# Patient Record
Sex: Female | Born: 1970 | Race: Black or African American | Hispanic: No | Smoking: Current every day smoker
Health system: Southern US, Community
[De-identification: ages and names within clinical notes are randomized; demographics above are authoritative.]

## PROBLEM LIST (undated history)

## (undated) DIAGNOSIS — M5430 Sciatica, unspecified side: Secondary | ICD-10-CM

## (undated) DIAGNOSIS — G8929 Other chronic pain: Secondary | ICD-10-CM

## (undated) DIAGNOSIS — F419 Anxiety disorder, unspecified: Secondary | ICD-10-CM

## (undated) DIAGNOSIS — J45909 Unspecified asthma, uncomplicated: Secondary | ICD-10-CM

## (undated) DIAGNOSIS — G43909 Migraine, unspecified, not intractable, without status migrainosus: Secondary | ICD-10-CM

## (undated) DIAGNOSIS — M797 Fibromyalgia: Secondary | ICD-10-CM

## (undated) DIAGNOSIS — M1990 Unspecified osteoarthritis, unspecified site: Secondary | ICD-10-CM

## (undated) DIAGNOSIS — G629 Polyneuropathy, unspecified: Secondary | ICD-10-CM

## (undated) DIAGNOSIS — I10 Essential (primary) hypertension: Secondary | ICD-10-CM

## (undated) DIAGNOSIS — E119 Type 2 diabetes mellitus without complications: Secondary | ICD-10-CM

## (undated) DIAGNOSIS — F329 Major depressive disorder, single episode, unspecified: Secondary | ICD-10-CM

## (undated) DIAGNOSIS — F32A Depression, unspecified: Secondary | ICD-10-CM

## (undated) HISTORY — PX: GASTRIC BYPASS: SHX52

## (undated) HISTORY — PX: OSTEOTOMY: SHX137

## (undated) HISTORY — PX: APPENDECTOMY: SHX54

---

## 2015-05-09 DIAGNOSIS — M545 Low back pain: Secondary | ICD-10-CM | POA: Diagnosis not present

## 2015-05-09 DIAGNOSIS — G894 Chronic pain syndrome: Secondary | ICD-10-CM | POA: Diagnosis not present

## 2015-05-09 DIAGNOSIS — M25512 Pain in left shoulder: Secondary | ICD-10-CM | POA: Diagnosis not present

## 2015-05-09 DIAGNOSIS — Z79891 Long term (current) use of opiate analgesic: Secondary | ICD-10-CM | POA: Diagnosis not present

## 2015-05-09 DIAGNOSIS — M542 Cervicalgia: Secondary | ICD-10-CM | POA: Diagnosis not present

## 2015-05-13 DIAGNOSIS — E559 Vitamin D deficiency, unspecified: Secondary | ICD-10-CM | POA: Diagnosis not present

## 2015-05-13 DIAGNOSIS — M797 Fibromyalgia: Secondary | ICD-10-CM | POA: Diagnosis not present

## 2015-05-13 DIAGNOSIS — M545 Low back pain: Secondary | ICD-10-CM | POA: Diagnosis not present

## 2015-05-13 DIAGNOSIS — Z885 Allergy status to narcotic agent status: Secondary | ICD-10-CM | POA: Diagnosis not present

## 2015-05-13 DIAGNOSIS — Z888 Allergy status to other drugs, medicaments and biological substances status: Secondary | ICD-10-CM | POA: Diagnosis not present

## 2015-05-13 DIAGNOSIS — I10 Essential (primary) hypertension: Secondary | ICD-10-CM | POA: Diagnosis not present

## 2015-05-13 DIAGNOSIS — G8929 Other chronic pain: Secondary | ICD-10-CM | POA: Diagnosis not present

## 2015-05-13 DIAGNOSIS — Z882 Allergy status to sulfonamides status: Secondary | ICD-10-CM | POA: Diagnosis not present

## 2015-05-13 DIAGNOSIS — Z880 Allergy status to penicillin: Secondary | ICD-10-CM | POA: Diagnosis not present

## 2015-05-13 DIAGNOSIS — R350 Frequency of micturition: Secondary | ICD-10-CM | POA: Diagnosis not present

## 2015-05-13 DIAGNOSIS — R32 Unspecified urinary incontinence: Secondary | ICD-10-CM | POA: Diagnosis not present

## 2015-05-13 DIAGNOSIS — F329 Major depressive disorder, single episode, unspecified: Secondary | ICD-10-CM | POA: Diagnosis not present

## 2015-05-13 DIAGNOSIS — Z23 Encounter for immunization: Secondary | ICD-10-CM | POA: Diagnosis not present

## 2015-05-13 DIAGNOSIS — E119 Type 2 diabetes mellitus without complications: Secondary | ICD-10-CM | POA: Diagnosis not present

## 2015-05-24 DIAGNOSIS — S39012D Strain of muscle, fascia and tendon of lower back, subsequent encounter: Secondary | ICD-10-CM | POA: Diagnosis not present

## 2015-05-24 DIAGNOSIS — R1012 Left upper quadrant pain: Secondary | ICD-10-CM | POA: Diagnosis not present

## 2015-05-24 DIAGNOSIS — M899 Disorder of bone, unspecified: Secondary | ICD-10-CM | POA: Diagnosis not present

## 2015-05-24 DIAGNOSIS — I10 Essential (primary) hypertension: Secondary | ICD-10-CM | POA: Diagnosis not present

## 2015-05-24 DIAGNOSIS — Z113 Encounter for screening for infections with a predominantly sexual mode of transmission: Secondary | ICD-10-CM | POA: Diagnosis not present

## 2015-05-24 DIAGNOSIS — G8929 Other chronic pain: Secondary | ICD-10-CM | POA: Diagnosis not present

## 2015-05-24 DIAGNOSIS — E119 Type 2 diabetes mellitus without complications: Secondary | ICD-10-CM | POA: Diagnosis not present

## 2015-05-24 DIAGNOSIS — F329 Major depressive disorder, single episode, unspecified: Secondary | ICD-10-CM | POA: Diagnosis not present

## 2015-05-24 DIAGNOSIS — Z30430 Encounter for insertion of intrauterine contraceptive device: Secondary | ICD-10-CM | POA: Diagnosis not present

## 2015-05-24 DIAGNOSIS — M549 Dorsalgia, unspecified: Secondary | ICD-10-CM | POA: Diagnosis not present

## 2015-06-06 DIAGNOSIS — M5442 Lumbago with sciatica, left side: Secondary | ICD-10-CM | POA: Diagnosis not present

## 2015-06-06 DIAGNOSIS — G8929 Other chronic pain: Secondary | ICD-10-CM | POA: Diagnosis not present

## 2015-06-06 DIAGNOSIS — M5441 Lumbago with sciatica, right side: Secondary | ICD-10-CM | POA: Diagnosis not present

## 2015-06-25 DIAGNOSIS — T8389XA Other specified complication of genitourinary prosthetic devices, implants and grafts, initial encounter: Secondary | ICD-10-CM | POA: Diagnosis not present

## 2015-06-25 DIAGNOSIS — N920 Excessive and frequent menstruation with regular cycle: Secondary | ICD-10-CM | POA: Diagnosis not present

## 2015-06-25 DIAGNOSIS — G8929 Other chronic pain: Secondary | ICD-10-CM | POA: Diagnosis not present

## 2015-07-10 DIAGNOSIS — J40 Bronchitis, not specified as acute or chronic: Secondary | ICD-10-CM | POA: Diagnosis not present

## 2015-08-01 DIAGNOSIS — M549 Dorsalgia, unspecified: Secondary | ICD-10-CM | POA: Diagnosis not present

## 2015-08-01 DIAGNOSIS — Z634 Disappearance and death of family member: Secondary | ICD-10-CM | POA: Diagnosis not present

## 2015-08-01 DIAGNOSIS — G4700 Insomnia, unspecified: Secondary | ICD-10-CM | POA: Diagnosis not present

## 2015-08-01 DIAGNOSIS — G8929 Other chronic pain: Secondary | ICD-10-CM | POA: Diagnosis not present

## 2015-08-31 DIAGNOSIS — G894 Chronic pain syndrome: Secondary | ICD-10-CM | POA: Diagnosis not present

## 2015-08-31 DIAGNOSIS — Z79891 Long term (current) use of opiate analgesic: Secondary | ICD-10-CM | POA: Diagnosis not present

## 2015-08-31 DIAGNOSIS — M542 Cervicalgia: Secondary | ICD-10-CM | POA: Diagnosis not present

## 2015-08-31 DIAGNOSIS — Z5181 Encounter for therapeutic drug level monitoring: Secondary | ICD-10-CM | POA: Diagnosis not present

## 2015-08-31 DIAGNOSIS — M25512 Pain in left shoulder: Secondary | ICD-10-CM | POA: Diagnosis not present

## 2015-08-31 DIAGNOSIS — M791 Myalgia: Secondary | ICD-10-CM | POA: Diagnosis not present

## 2015-08-31 DIAGNOSIS — M545 Low back pain: Secondary | ICD-10-CM | POA: Diagnosis not present

## 2015-09-06 DIAGNOSIS — M545 Low back pain: Secondary | ICD-10-CM | POA: Diagnosis not present

## 2015-09-06 DIAGNOSIS — T8389XA Other specified complication of genitourinary prosthetic devices, implants and grafts, initial encounter: Secondary | ICD-10-CM | POA: Diagnosis not present

## 2015-09-06 DIAGNOSIS — N921 Excessive and frequent menstruation with irregular cycle: Secondary | ICD-10-CM | POA: Diagnosis not present

## 2015-09-06 DIAGNOSIS — N99821 Postprocedural hemorrhage and hematoma of a genitourinary system organ or structure following other procedure: Secondary | ICD-10-CM | POA: Diagnosis not present

## 2015-09-06 DIAGNOSIS — Z79899 Other long term (current) drug therapy: Secondary | ICD-10-CM | POA: Diagnosis not present

## 2015-09-06 DIAGNOSIS — M174 Other bilateral secondary osteoarthritis of knee: Secondary | ICD-10-CM | POA: Diagnosis not present

## 2015-09-06 DIAGNOSIS — N920 Excessive and frequent menstruation with regular cycle: Secondary | ICD-10-CM | POA: Diagnosis not present

## 2015-09-06 DIAGNOSIS — Z1389 Encounter for screening for other disorder: Secondary | ICD-10-CM | POA: Diagnosis not present

## 2015-09-06 DIAGNOSIS — E119 Type 2 diabetes mellitus without complications: Secondary | ICD-10-CM | POA: Diagnosis not present

## 2015-09-06 DIAGNOSIS — Z87440 Personal history of urinary (tract) infections: Secondary | ICD-10-CM | POA: Diagnosis not present

## 2015-09-06 DIAGNOSIS — G8929 Other chronic pain: Secondary | ICD-10-CM | POA: Diagnosis not present

## 2015-10-30 DIAGNOSIS — M545 Low back pain: Secondary | ICD-10-CM | POA: Diagnosis not present

## 2015-10-30 DIAGNOSIS — G8929 Other chronic pain: Secondary | ICD-10-CM | POA: Diagnosis not present

## 2015-11-08 DIAGNOSIS — M791 Myalgia: Secondary | ICD-10-CM | POA: Diagnosis not present

## 2015-11-08 DIAGNOSIS — Z79891 Long term (current) use of opiate analgesic: Secondary | ICD-10-CM | POA: Diagnosis not present

## 2015-11-08 DIAGNOSIS — M545 Low back pain: Secondary | ICD-10-CM | POA: Diagnosis not present

## 2015-11-08 DIAGNOSIS — G894 Chronic pain syndrome: Secondary | ICD-10-CM | POA: Diagnosis not present

## 2015-11-08 DIAGNOSIS — M25512 Pain in left shoulder: Secondary | ICD-10-CM | POA: Diagnosis not present

## 2015-11-08 DIAGNOSIS — M542 Cervicalgia: Secondary | ICD-10-CM | POA: Diagnosis not present

## 2015-12-07 DIAGNOSIS — M174 Other bilateral secondary osteoarthritis of knee: Secondary | ICD-10-CM | POA: Diagnosis not present

## 2015-12-07 DIAGNOSIS — M5136 Other intervertebral disc degeneration, lumbar region: Secondary | ICD-10-CM | POA: Diagnosis not present

## 2015-12-07 DIAGNOSIS — M5137 Other intervertebral disc degeneration, lumbosacral region: Secondary | ICD-10-CM | POA: Diagnosis not present

## 2015-12-07 DIAGNOSIS — G8929 Other chronic pain: Secondary | ICD-10-CM | POA: Diagnosis not present

## 2015-12-07 DIAGNOSIS — M5442 Lumbago with sciatica, left side: Secondary | ICD-10-CM | POA: Diagnosis not present

## 2015-12-07 DIAGNOSIS — M5441 Lumbago with sciatica, right side: Secondary | ICD-10-CM | POA: Diagnosis not present

## 2016-01-10 DIAGNOSIS — Z79891 Long term (current) use of opiate analgesic: Secondary | ICD-10-CM | POA: Diagnosis not present

## 2016-01-10 DIAGNOSIS — M791 Myalgia: Secondary | ICD-10-CM | POA: Diagnosis not present

## 2016-01-10 DIAGNOSIS — G894 Chronic pain syndrome: Secondary | ICD-10-CM | POA: Diagnosis not present

## 2016-01-10 DIAGNOSIS — M545 Low back pain: Secondary | ICD-10-CM | POA: Diagnosis not present

## 2016-01-10 DIAGNOSIS — M542 Cervicalgia: Secondary | ICD-10-CM | POA: Diagnosis not present

## 2016-01-10 DIAGNOSIS — M25512 Pain in left shoulder: Secondary | ICD-10-CM | POA: Diagnosis not present

## 2016-01-28 DIAGNOSIS — J029 Acute pharyngitis, unspecified: Secondary | ICD-10-CM | POA: Diagnosis not present

## 2016-01-28 DIAGNOSIS — R509 Fever, unspecified: Secondary | ICD-10-CM | POA: Diagnosis not present

## 2016-01-28 DIAGNOSIS — I10 Essential (primary) hypertension: Secondary | ICD-10-CM | POA: Diagnosis not present

## 2016-01-28 DIAGNOSIS — F17210 Nicotine dependence, cigarettes, uncomplicated: Secondary | ICD-10-CM | POA: Diagnosis not present

## 2016-01-28 DIAGNOSIS — E119 Type 2 diabetes mellitus without complications: Secondary | ICD-10-CM | POA: Diagnosis not present

## 2016-01-28 DIAGNOSIS — R062 Wheezing: Secondary | ICD-10-CM | POA: Diagnosis not present

## 2016-01-28 DIAGNOSIS — R0602 Shortness of breath: Secondary | ICD-10-CM | POA: Diagnosis not present

## 2016-01-28 DIAGNOSIS — M797 Fibromyalgia: Secondary | ICD-10-CM | POA: Diagnosis not present

## 2016-01-28 DIAGNOSIS — Z8249 Family history of ischemic heart disease and other diseases of the circulatory system: Secondary | ICD-10-CM | POA: Diagnosis not present

## 2016-01-28 DIAGNOSIS — J45909 Unspecified asthma, uncomplicated: Secondary | ICD-10-CM | POA: Diagnosis not present

## 2016-01-28 DIAGNOSIS — F329 Major depressive disorder, single episode, unspecified: Secondary | ICD-10-CM | POA: Diagnosis not present

## 2016-01-28 DIAGNOSIS — J069 Acute upper respiratory infection, unspecified: Secondary | ICD-10-CM | POA: Diagnosis not present

## 2016-01-28 DIAGNOSIS — Z9884 Bariatric surgery status: Secondary | ICD-10-CM | POA: Diagnosis not present

## 2016-02-14 DIAGNOSIS — Z23 Encounter for immunization: Secondary | ICD-10-CM | POA: Diagnosis not present

## 2016-03-14 DIAGNOSIS — M542 Cervicalgia: Secondary | ICD-10-CM | POA: Diagnosis not present

## 2016-03-14 DIAGNOSIS — M545 Low back pain: Secondary | ICD-10-CM | POA: Diagnosis not present

## 2016-03-14 DIAGNOSIS — M25512 Pain in left shoulder: Secondary | ICD-10-CM | POA: Diagnosis not present

## 2016-03-14 DIAGNOSIS — M791 Myalgia: Secondary | ICD-10-CM | POA: Diagnosis not present

## 2016-03-14 DIAGNOSIS — G894 Chronic pain syndrome: Secondary | ICD-10-CM | POA: Diagnosis not present

## 2016-03-14 DIAGNOSIS — Z79891 Long term (current) use of opiate analgesic: Secondary | ICD-10-CM | POA: Diagnosis not present

## 2016-03-25 DIAGNOSIS — Z8249 Family history of ischemic heart disease and other diseases of the circulatory system: Secondary | ICD-10-CM | POA: Diagnosis not present

## 2016-03-25 DIAGNOSIS — J45909 Unspecified asthma, uncomplicated: Secondary | ICD-10-CM | POA: Diagnosis not present

## 2016-03-25 DIAGNOSIS — M797 Fibromyalgia: Secondary | ICD-10-CM | POA: Diagnosis not present

## 2016-03-25 DIAGNOSIS — J029 Acute pharyngitis, unspecified: Secondary | ICD-10-CM | POA: Diagnosis not present

## 2016-03-25 DIAGNOSIS — E119 Type 2 diabetes mellitus without complications: Secondary | ICD-10-CM | POA: Diagnosis not present

## 2016-03-25 DIAGNOSIS — F17210 Nicotine dependence, cigarettes, uncomplicated: Secondary | ICD-10-CM | POA: Diagnosis not present

## 2016-03-25 DIAGNOSIS — R062 Wheezing: Secondary | ICD-10-CM | POA: Diagnosis not present

## 2016-03-25 DIAGNOSIS — R509 Fever, unspecified: Secondary | ICD-10-CM | POA: Diagnosis not present

## 2016-03-25 DIAGNOSIS — I10 Essential (primary) hypertension: Secondary | ICD-10-CM | POA: Diagnosis not present

## 2016-03-25 DIAGNOSIS — Z9884 Bariatric surgery status: Secondary | ICD-10-CM | POA: Diagnosis not present

## 2016-03-25 DIAGNOSIS — R0981 Nasal congestion: Secondary | ICD-10-CM | POA: Diagnosis not present

## 2016-04-01 ENCOUNTER — Ambulatory Visit
Admission: EM | Admit: 2016-04-01 | Discharge: 2016-04-01 | Disposition: A | Discharge status: Home / Self Care | Payer: Medicare Other | Attending: Family Medicine | Admitting: Family Medicine

## 2016-04-01 DIAGNOSIS — J069 Acute upper respiratory infection, unspecified: Secondary | ICD-10-CM

## 2016-04-01 HISTORY — DX: Unspecified osteoarthritis, unspecified site: M19.90

## 2016-04-01 HISTORY — DX: Other chronic pain: G89.29

## 2016-04-01 HISTORY — DX: Anxiety disorder, unspecified: F41.9

## 2016-04-01 HISTORY — DX: Unspecified asthma, uncomplicated: J45.909

## 2016-04-01 HISTORY — DX: Sciatica, unspecified side: M54.30

## 2016-04-01 HISTORY — DX: Essential (primary) hypertension: I10

## 2016-04-01 HISTORY — DX: Type 2 diabetes mellitus without complications: E11.9

## 2016-04-01 HISTORY — DX: Migraine, unspecified, not intractable, without status migrainosus: G43.909

## 2016-04-01 HISTORY — DX: Fibromyalgia: M79.7

## 2016-04-01 HISTORY — DX: Major depressive disorder, single episode, unspecified: F32.9

## 2016-04-01 HISTORY — DX: Polyneuropathy, unspecified: G62.9

## 2016-04-01 HISTORY — DX: Depression, unspecified: F32.A

## 2016-04-01 MED ORDER — PREDNISONE 10 MG (21) PO TBPK: 10.0000 mg | ORAL_TABLET | Freq: Every day | ORAL | 0 refills | Status: DC

## 2016-04-01 MED ORDER — AZITHROMYCIN 250 MG PO TABS: 250.0000 mg | ORAL_TABLET | Freq: Every day | ORAL | 0 refills | Status: DC

## 2016-04-01 NOTE — Discharge Instructions (Signed)
You will need to decrease or quit smoking this can make symptoms worse Use your albuterol inh as needed Take full dose of abx You will need to follow up with PCP once complete abx

## 2016-04-01 NOTE — ED Triage Notes (Signed)
Pt with congestion, cough, bloody and green nasal drainage, headaches, sore throat and low grade fever. Pt seen in [place] for same but was told it was viral. Had negative flu and negative strep test. Pain 7/10

## 2016-04-01 NOTE — ED Provider Notes (Signed)
CSN: [id number]     Arrival date & time 04/01/16  0930 History   First MD Initiated Contact with Patient 04/01/16 1013     Chief Complaint  Patient presents with  . Cough   (Consider location/radiation/quality/duration/timing/severity/associated sxs/prior Treatment) Pt has had a productive cough for over 1 week. Was seen and told she had a viral infection, but has had increase phlem and generalized not feeling well since. Taking some OTC decongestants with no relief. Using an inhaler with no relief. HX of heavy smoking, hx COPD.       Past Medical History:  Diagnosis Date  . Anxiety   . Asthma   . Chronic pain   . Depression   . Diabetes (HCC)   . Fibromyalgia   . HTN (hypertension)   . Migraines   . Neuropathy (HCC)   . Osteoarthritis   . Sciatica    Past Surgical History:  Procedure Laterality Date  . APPENDECTOMY    . GASTRIC BYPASS    . OSTEOTOMY     History reviewed. No pertinent family history. Social History  Substance Use Topics  . Smoking status: Current Every Day Smoker    Packs/day: 1.00    Types: Cigarettes  . Smokeless tobacco: Never Used  . Alcohol use Yes     Comment: social   OB History    No data available     Review of Systems  Constitutional: Negative.   HENT: Negative.   Respiratory: Positive for cough and wheezing.        Yellow/green phlem denies any chest pain   Cardiovascular: Negative.   Gastrointestinal: Negative.   Skin: Negative.   Neurological: Negative.     Allergies  Phenobarbital; Sulfa antibiotics; Tylenol with codeine #3 [acetaminophen-codeine]; and Naprosyn [naproxen]  Home Medications   Prior to Admission medications   Medication Sig Start Date End Date Taking? Authorizing Provider  albuterol (PROVENTIL HFA;VENTOLIN HFA) 108 (90 Base) MCG/ACT inhaler Inhale into the lungs every 6 (six) hours as needed for wheezing or shortness of breath.   Yes Historical Provider, MD  amLODipine (NORVASC) 10 MG tablet Take 10 mg  by mouth daily.   Yes Historical Provider, MD  baclofen (LIORESAL) 10 MG tablet Take 10 mg by mouth 3 (three) times daily.   Yes Historical Provider, MD  DULoxetine (CYMBALTA) 30 MG capsule Take 30 mg by mouth daily.   Yes Historical Provider, MD  DULoxetine (CYMBALTA) 60 MG capsule Take 60 mg by mouth daily.   Yes Historical Provider, MD  lisinopril (PRINIVIL,ZESTRIL) 2.5 MG tablet Take 2.5 mg by mouth daily.   Yes Historical Provider, MD  metFORMIN (GLUCOPHAGE) 500 MG tablet Take by mouth 2 (two) times daily with a meal.   Yes Historical Provider, MD  mometasone (NASONEX) 50 MCG/ACT nasal spray Place 2 sprays into the nose daily.   Yes Historical Provider, MD  pregabalin (LYRICA) 200 MG capsule Take 200 mg by mouth 3 (three) times daily.   Yes Historical Provider, MD  Tapentadol HCl (NUCYNTA) 100 MG TABS Take 100 mg by mouth 2 (two) times daily.   Yes Historical Provider, MD  tiZANidine (ZANAFLEX) 4 MG capsule Take 4 mg by mouth 3 (three) times daily.   Yes Historical Provider, MD  azithromycin (ZITHROMAX) 250 MG tablet Take 1 tablet (250 mg total) by mouth daily. Take first 2 tablets together, then 1 every day until finished. 04/01/16   Tobi BastosMelanie A Carmine Youngberg, NP  predniSONE (STERAPRED UNI-PAK 21 TAB) 10 MG (21) TBPK tablet  Take 1 tablet (10 mg total) by mouth daily. Take 6 tabs by mouth daily  for 2 days, then 5 tabs for 2 days, then 4 tabs for 2 days, then 3 tabs for 2 days, 2 tabs for 2 days, then 1 tab by mouth daily for 2 days 04/01/16   Tobi BastosMelanie A Jozelynn Danielson, NP   Meds Ordered and Administered this Visit  Medications - No data to display  BP 126/86 (BP Location: Left Arm)   Pulse 100   Temp 98.6 F (37 C) (Oral)   Resp 20   Ht 5\' 7"  (1.702 m)   Wt 216 lb (98 kg)   LMP 03/07/2016   SpO2 99%   BMI 33.83 kg/m  No data found.   Physical Exam  Constitutional: She appears well-developed.  HENT:  Head: Normocephalic.  Eyes: Pupils are equal, round, and reactive to light.  Neck: Normal  range of motion.  Cardiovascular: Normal rate and regular rhythm.   Pulmonary/Chest: She has wheezes.  Productive green phlem cough, wheezing Bil lower base.   Abdominal: Soft. Bowel sounds are normal.  Musculoskeletal: Normal range of motion.  Neurological: She is alert.  Skin: Skin is warm. Capillary refill takes less than 2 seconds.    Urgent Care Course   Clinical Course     Procedures (including critical care time)  Labs Review Labs Reviewed - No data to display  I        MDM   1. Upper respiratory tract infection, unspecified type    You will need to decrease or quit smoking this can make symptoms worse  Discussed the risk with COPD and smoking  Discussed that having diabetes monitor sugar levels while taking prednisone may cause and elevation.  Use your albuterol inh as needed Take full dose of abx You will need to follow up with PCP once complete abx   Tobi BastosMelanie A Paizley Ramella, NP 04/01/16 1023

## 2016-04-04 ENCOUNTER — Telehealth: Payer: Self-pay

## 2016-04-04 NOTE — Telephone Encounter (Signed)
No number listed in chart to make a courtesy call back. [facility]

## 2016-05-24 ENCOUNTER — Encounter: Payer: Self-pay | Admitting: *Deleted

## 2016-05-24 ENCOUNTER — Ambulatory Visit
Admission: EM | Admit: 2016-05-24 | Discharge: 2016-05-24 | Disposition: A | Discharge status: Home / Self Care | Payer: Medicare Other | Attending: Family Medicine | Admitting: Family Medicine

## 2016-05-24 DIAGNOSIS — J0110 Acute frontal sinusitis, unspecified: Secondary | ICD-10-CM

## 2016-05-24 DIAGNOSIS — J209 Acute bronchitis, unspecified: Secondary | ICD-10-CM | POA: Diagnosis not present

## 2016-05-24 MED ORDER — PREDNISONE 20 MG PO TABS: 40.0000 mg | ORAL_TABLET | Freq: Every day | ORAL | 0 refills | Status: AC

## 2016-05-24 MED ORDER — FLUCONAZOLE 150 MG PO TABS: 150.0000 mg | ORAL_TABLET | Freq: Once | ORAL | 0 refills | Status: AC

## 2016-05-24 MED ORDER — AMOXICILLIN-POT CLAVULANATE 875-125 MG PO TABS: 1.0000 | ORAL_TABLET | Freq: Two times a day (BID) | ORAL | 0 refills | Status: AC

## 2016-05-24 NOTE — ED Triage Notes (Signed)
Productive cough- yellow, fever, runny nose and post nasal drainage since Friday.

## 2016-05-24 NOTE — Discharge Instructions (Signed)
Start Augmentin 875mg  twice a day as directed. Use Prednisone 40mg  daily for 5 days. Increase fluid intake to help loosen up mucus. Follow-up with your primary care provider in 4 to 5 days if not improving.

## 2016-05-24 NOTE — ED Provider Notes (Signed)
CSN: [id number]     Arrival date & time 05/24/16  1158 History   First MD Initiated Contact with Patient 05/24/16 1317     Chief Complaint  Patient presents with  . Cough  . Fever  . Nasal Congestion  . Nausea   (Consider location/radiation/quality/duration/timing/severity/associated sxs/prior Treatment) 46 year old female presents with nasal congestion, coughing, wheezing and slight nausea for about 1 week. Denies any fever, sore throat or vomiting. Coughing up thick yellow mucus and experiencing sinus pressure. Has taken Tylenol and other OTC cold/cough medications with minimal relief. Has history of multiple chronic health issues and on many medications.  Was sick with URI/Bronchitis in December 2017 and seen here. Placed on Zithromax and Prednisone with good success.    The history is provided by the patient.    Past Medical History:  Diagnosis Date  . Anxiety   . Asthma   . Chronic pain   . Depression   . Diabetes (HCC)   . Fibromyalgia   . HTN (hypertension)   . Migraines   . Neuropathy (HCC)   . Osteoarthritis   . Sciatica    Past Surgical History:  Procedure Laterality Date  . APPENDECTOMY    . GASTRIC BYPASS    . OSTEOTOMY     History reviewed. No pertinent family history. Social History  Substance Use Topics  . Smoking status: Current Every Day Smoker    Packs/day: 1.00    Types: Cigarettes  . Smokeless tobacco: Never Used  . Alcohol use Yes     Comment: social   OB History    No data available     Review of Systems  Constitutional: Positive for fatigue. Negative for chills and fever.  HENT: Positive for congestion, postnasal drip and sinus pressure. Negative for ear pain, sinus pain and sore throat.   Eyes: Negative for discharge.  Respiratory: Positive for cough and wheezing. Negative for chest tightness and shortness of breath.   Cardiovascular: Negative for chest pain.  Gastrointestinal: Positive for nausea. Negative for abdominal pain, diarrhea  and vomiting.  Musculoskeletal: Negative for neck pain and neck stiffness.  Skin: Negative for rash.  Allergic/Immunologic: Positive for immunocompromised state.  Neurological: Positive for headaches. Negative for dizziness, syncope, weakness and light-headedness.  Hematological: Negative for adenopathy.    Allergies  Phenobarbital; Sulfa antibiotics; Tylenol with codeine #3 [acetaminophen-codeine]; and Naprosyn [naproxen]  Home Medications   Prior to Admission medications   Medication Sig Start Date End Date Taking? Authorizing Provider  albuterol (PROVENTIL HFA;VENTOLIN HFA) 108 (90 Base) MCG/ACT inhaler Inhale into the lungs every 6 (six) hours as needed for wheezing or shortness of breath.   Yes Historical Provider, MD  amitriptyline (ELAVIL) 10 MG tablet Take 10 mg by mouth at bedtime.   Yes Historical Provider, MD  amLODipine (NORVASC) 10 MG tablet Take 10 mg by mouth daily.   Yes Historical Provider, MD  baclofen (LIORESAL) 10 MG tablet Take 10 mg by mouth 3 (three) times daily.   Yes Historical Provider, MD  DULoxetine (CYMBALTA) 30 MG capsule Take 30 mg by mouth daily.   Yes Historical Provider, MD  DULoxetine (CYMBALTA) 60 MG capsule Take 60 mg by mouth daily.   Yes Historical Provider, MD  lisinopril (PRINIVIL,ZESTRIL) 2.5 MG tablet Take 2.5 mg by mouth daily.   Yes Historical Provider, MD  metFORMIN (GLUCOPHAGE) 500 MG tablet Take by mouth 2 (two) times daily with a meal.   Yes Historical Provider, MD  mometasone (NASONEX) 50 MCG/ACT nasal  spray Place 2 sprays into the nose daily.   Yes Historical Provider, MD  pregabalin (LYRICA) 200 MG capsule Take 200 mg by mouth 3 (three) times daily.   Yes Historical Provider, MD  Tapentadol HCl (NUCYNTA) 100 MG TABS Take 100 mg by mouth 2 (two) times daily.   Yes Historical Provider, MD  tiZANidine (ZANAFLEX) 4 MG capsule Take 4 mg by mouth 3 (three) times daily.   Yes Historical Provider, MD  amoxicillin-clavulanate (AUGMENTIN) 875-125  MG tablet Take 1 tablet by mouth every 12 (twelve) hours. 05/24/16 05/31/16  Sudie Grumbling, NP  fluconazole (DIFLUCAN) 150 MG tablet Take 1 tablet (150 mg total) by mouth once. May repeat at end of antibiotic use if needed 05/24/16 05/24/16  Sudie Grumbling, NP  predniSONE (DELTASONE) 20 MG tablet Take 2 tablets (40 mg total) by mouth daily. 05/24/16 05/29/16  Sudie Grumbling, NP   Meds Ordered and Administered this Visit  Medications - No data to display  BP 117/87 (BP Location: Left Arm)   Pulse 93   Temp 98.3 F (36.8 C) (Oral)   Resp 16   Ht 5\' 7"  (1.702 m)   Wt 215 lb (97.5 kg)   LMP 05/21/2016 (Exact Date)   SpO2 100%   BMI 33.67 kg/m  No data found.   Physical Exam  Constitutional: She is oriented to person, place, and time. She appears well-developed and well-nourished. She appears ill. No distress.  HENT:  Head: Normocephalic and atraumatic.  Right Ear: Hearing, external ear and ear canal normal. Tympanic membrane is injected and bulging. A middle ear effusion is present.  Left Ear: Hearing, tympanic membrane, external ear and ear canal normal.  Nose: Mucosal edema and rhinorrhea present. Right sinus exhibits frontal sinus tenderness. Right sinus exhibits no maxillary sinus tenderness. Left sinus exhibits frontal sinus tenderness. Left sinus exhibits no maxillary sinus tenderness.  Mouth/Throat: Uvula is midline, oropharynx is clear and moist and mucous membranes are normal.  Neck: Normal range of motion. Neck supple.  Cardiovascular: Normal rate, regular rhythm and normal heart sounds.   Pulmonary/Chest: Effort normal. She has decreased breath sounds in the right upper field, the right lower field, the left upper field and the left lower field. She has wheezes in the right upper field, the right lower field, the left upper field and the left lower field. She has rhonchi in the right upper field, the right lower field, the left upper field and the left lower field. She has no rales.   Lymphadenopathy:    She has no cervical adenopathy.  Neurological: She is alert and oriented to person, place, and time.  Skin: Skin is warm and dry. Capillary refill takes less than 2 seconds.  Psychiatric: She has a normal mood and affect. Her behavior is normal. Judgment and thought content normal.    Urgent Care Course     Procedures (including critical care time)  Labs Review Labs Reviewed - No data to display  Imaging Review No results found.   Visual Acuity Review  Right Eye Distance:   Left Eye Distance:   Bilateral Distance:    Right Eye Near:   Left Eye Near:    Bilateral Near:         MDM   1. Acute bronchitis, unspecified organism   2. Acute non-recurrent frontal sinusitis    Recommend start Augmentin 875mg  twice a day as directed. May use Prednisone 40mg  daily for 5 days. May take Diflucan 150mg  one time at  beginning of antibiotic use and may repeat 1 tablet again at end of antibiotic if needed to prevent antibiotic-induced yeast infection. Encouraged to increase fluid intake to help loosen up mucus. Continue Nasonex and Albuterol inhaler as directed. Strongly encouraged to stop smoking. Follow-up with her primary care provider in 4 to 5 days if not improving.      Sudie GrumblingAnn Berry Hermione Havlicek, NP 05/24/16 2150

## 2016-06-04 ENCOUNTER — Encounter: Payer: Self-pay | Admitting: Emergency Medicine

## 2016-06-04 ENCOUNTER — Ambulatory Visit
Admission: EM | Admit: 2016-06-04 | Discharge: 2016-06-04 | Disposition: A | Discharge status: Home / Self Care | Payer: Medicare Other | Attending: Internal Medicine | Admitting: Internal Medicine

## 2016-06-04 DIAGNOSIS — R0981 Nasal congestion: Secondary | ICD-10-CM | POA: Diagnosis not present

## 2016-06-04 DIAGNOSIS — R059 Cough, unspecified: Secondary | ICD-10-CM

## 2016-06-04 DIAGNOSIS — R05 Cough: Secondary | ICD-10-CM

## 2016-06-04 MED ORDER — FLUTICASONE PROPIONATE 50 MCG/ACT NA SUSP: 1.0000 | Freq: Two times a day (BID) | NASAL | 2 refills | Status: DC

## 2016-06-04 MED ORDER — GUAIFENESIN ER 600 MG PO TB12: 1200.0000 mg | ORAL_TABLET | Freq: Two times a day (BID) | ORAL | 0 refills | Status: AC | PRN

## 2016-06-04 MED ORDER — BENZONATATE 100 MG PO CAPS: 200.0000 mg | ORAL_CAPSULE | Freq: Three times a day (TID) | ORAL | 0 refills | Status: DC

## 2016-06-04 NOTE — ED Triage Notes (Signed)
Patient c/o nasal congestion and dizziness that started on Saturday.  Patient finish prednisone on Friday and finished Augmentin on Friday.  Patient denies fevers. Patient reports ongoing cough and chest congestion.

## 2016-06-04 NOTE — ED Provider Notes (Signed)
[facility]    CSN: [id number] Arrival date & time: 06/04/16  1612     History   Chief Complaint Chief Complaint  Patient presents with  . Nasal Congestion  . Dizziness    HPI Sally Barnes is a 46 y.o. female.   C/o nasal congestion and positional dizziness. Treated for bronchitis last week (abx + steroids).  Still has cough.  Using albuterol as needed.  Denies fevers.  PMH sig for asthma      Past Medical History:  Diagnosis Date  . Anxiety   . Asthma   . Chronic pain   . Depression   . Diabetes (HCC)   . Fibromyalgia   . HTN (hypertension)   . Migraines   . Neuropathy (HCC)   . Osteoarthritis   . Sciatica     There are no active problems to display for this patient.   Past Surgical History:  Procedure Laterality Date  . APPENDECTOMY    . GASTRIC BYPASS    . OSTEOTOMY      OB History    No data available       Home Medications    Prior to Admission medications   Medication Sig Start Date End Date Taking? Authorizing Provider  albuterol (PROVENTIL HFA;VENTOLIN HFA) 108 (90 Base) MCG/ACT inhaler Inhale into the lungs every 6 (six) hours as needed for wheezing or shortness of breath.    Historical Provider, MD  amitriptyline (ELAVIL) 10 MG tablet Take 10 mg by mouth at bedtime.    Historical Provider, MD  amLODipine (NORVASC) 10 MG tablet Take 10 mg by mouth daily.    Historical Provider, MD  baclofen (LIORESAL) 10 MG tablet Take 10 mg by mouth 3 (three) times daily.    Historical Provider, MD  benzonatate (TESSALON) 100 MG capsule Take 2 capsules (200 mg total) by mouth every 8 (eight) hours. 06/04/16   Arnaldo NatalMichael S Constance Whittle, MD  DULoxetine (CYMBALTA) 30 MG capsule Take 30 mg by mouth daily.    Historical Provider, MD  DULoxetine (CYMBALTA) 60 MG capsule Take 60 mg by mouth daily.    Historical Provider, MD  fluticasone (FLONASE) 50 MCG/ACT nasal spray Place 1 spray into both nostrils 2 (two) times daily. 06/04/16   Arnaldo NatalMichael S Pearl Bents, MD    guaiFENesin (MUCINEX) 600 MG 12 hr tablet Take 2 tablets (1,200 mg total) by mouth 2 (two) times daily as needed for cough or to loosen phlegm. 06/04/16 06/09/16  Arnaldo NatalMichael S Bridget Jolly, MD  lisinopril (PRINIVIL,ZESTRIL) 2.5 MG tablet Take 2.5 mg by mouth daily.    Historical Provider, MD  metFORMIN (GLUCOPHAGE) 500 MG tablet Take by mouth 2 (two) times daily with a meal.    Historical Provider, MD  mometasone (NASONEX) 50 MCG/ACT nasal spray Place 2 sprays into the nose daily.    Historical Provider, MD  pregabalin (LYRICA) 200 MG capsule Take 200 mg by mouth 3 (three) times daily.    Historical Provider, MD  Tapentadol HCl (NUCYNTA) 100 MG TABS Take 100 mg by mouth 2 (two) times daily.    Historical Provider, MD  tiZANidine (ZANAFLEX) 4 MG capsule Take 4 mg by mouth 3 (three) times daily.    Historical Provider, MD    Family History History reviewed. No pertinent family history.  Social History Social History  Substance Use Topics  . Smoking status: Current Every Day Smoker    Packs/day: 1.00    Types: Cigarettes  . Smokeless tobacco: Never Used  . Alcohol use  Yes     Comment: social     Allergies   Phenobarbital; Sulfa antibiotics; Tylenol with codeine #3 [acetaminophen-codeine]; and Naprosyn [naproxen]   Review of Systems Review of Systems  Constitutional: Negative for chills and fever.  HENT: Negative for sore throat and tinnitus.   Eyes: Negative for redness.  Respiratory: Positive for cough and wheezing (intermittent). Negative for shortness of breath.   Cardiovascular: Negative for chest pain and palpitations.  Gastrointestinal: Negative for abdominal pain, diarrhea, nausea and vomiting.  Genitourinary: Negative for dysuria, frequency and urgency.  Musculoskeletal: Negative for myalgias.  Skin: Negative for rash.       No lesions  Neurological: Positive for dizziness. Negative for weakness.  Hematological: Does not bruise/bleed easily.  Psychiatric/Behavioral: Negative  for suicidal ideas.     Physical Exam Triage Vital Signs ED Triage Vitals  Enc Vitals Group     BP 06/04/16 1726 138/73     Pulse Rate 06/04/16 1726 92     Resp 06/04/16 1726 16     Temp 06/04/16 1726 98.3 F (36.8 C)     Temp Source 06/04/16 1726 Oral     SpO2 06/04/16 1726 100 %     Weight 06/04/16 1724 225 lb (102.1 kg)     Height 06/04/16 1724 5\' 7"  (1.702 m)     Head Circumference --      Peak Flow --      Pain Score 06/04/16 1725 0     Pain Loc --      Pain Edu? --      Excl. in GC? --    No data found.   Updated Vital Signs BP 138/73 (BP Location: Left Arm)   Pulse 92   Temp 98.3 F (36.8 C) (Oral)   Resp 16   Ht 5\' 7"  (1.702 m)   Wt 225 lb (102.1 kg)   LMP 05/21/2016 (Exact Date)   SpO2 100%   BMI 35.24 kg/m   Visual Acuity Right Eye Distance:   Left Eye Distance:   Bilateral Distance:    Right Eye Near:   Left Eye Near:    Bilateral Near:     Physical Exam  Constitutional: She is oriented to person, place, and time. She appears well-developed and well-nourished. No distress.  HENT:  Head: Normocephalic and atraumatic.  Mouth/Throat: Oropharynx is clear and moist.  Eyes: Conjunctivae and EOM are normal. Pupils are equal, round, and reactive to light. No scleral icterus.  Neck: Normal range of motion. Neck supple. No JVD present. No tracheal deviation present. No thyromegaly present.  Cardiovascular: Normal rate, regular rhythm and normal heart sounds.  Exam reveals no gallop and no friction rub.   No murmur heard. Pulmonary/Chest: Effort normal and breath sounds normal.  Abdominal: Soft. Bowel sounds are normal. She exhibits no distension. There is no tenderness.  Musculoskeletal: Normal range of motion. She exhibits no edema.  Lymphadenopathy:    She has no cervical adenopathy.  Neurological: She is alert and oriented to person, place, and time. No cranial nerve deficit.  Skin: Skin is warm and dry.  Psychiatric: She has a normal mood and  affect. Her behavior is normal. Judgment and thought content normal.  Nursing note and vitals reviewed.    UC Treatments / Results  Labs (all labs ordered are listed, but only abnormal results are displayed) Labs Reviewed - No data to display  EKG  EKG Interpretation None       Radiology No results found.  Procedures  Procedures (including critical care time)  Medications Ordered in UC Medications - No data to display   Initial Impression / Assessment and Plan / UC Course  I have reviewed the triage vital signs and the nursing notes.  Pertinent labs & imaging results that were available during my care of the patient were reviewed by me and considered in my medical decision making (see chart for details).     No sx of sinusitis.  Recent abx would cover nasopharyngeal flora anyway.  Likely mucus and air/fluid levels in sinuses causing dizziness.   Symptomatic treatment  Final Clinical Impressions(s) / UC Diagnoses   Final diagnoses:  Sinus congestion  Cough    New Prescriptions Discharge Medication List as of 06/04/2016  7:07 PM    START taking these medications   Details  benzonatate (TESSALON) 100 MG capsule Take 2 capsules (200 mg total) by mouth every 8 (eight) hours., Starting Mon 06/04/2016, Normal    fluticasone (FLONASE) 50 MCG/ACT nasal spray Place 1 spray into both nostrils 2 (two) times daily., Starting Mon 06/04/2016, Normal    guaiFENesin (MUCINEX) 600 MG 12 hr tablet Take 2 tablets (1,200 mg total) by mouth 2 (two) times daily as needed for cough or to loosen phlegm., Starting Mon 06/04/2016, Until Sat 06/09/2016, Normal         Arnaldo Natal, MD 06/04/16 [phone]

## 2016-06-16 ENCOUNTER — Ambulatory Visit
Admission: EM | Admit: 2016-06-16 | Discharge: 2016-06-16 | Disposition: A | Discharge status: Home / Self Care | Payer: Medicare Other | Attending: Emergency Medicine | Admitting: Emergency Medicine

## 2016-06-16 ENCOUNTER — Encounter: Payer: Self-pay | Admitting: Emergency Medicine

## 2016-06-16 DIAGNOSIS — F17210 Nicotine dependence, cigarettes, uncomplicated: Secondary | ICD-10-CM | POA: Insufficient documentation

## 2016-06-16 DIAGNOSIS — J029 Acute pharyngitis, unspecified: Secondary | ICD-10-CM | POA: Insufficient documentation

## 2016-06-16 DIAGNOSIS — Z79899 Other long term (current) drug therapy: Secondary | ICD-10-CM | POA: Insufficient documentation

## 2016-06-16 DIAGNOSIS — R0982 Postnasal drip: Secondary | ICD-10-CM | POA: Insufficient documentation

## 2016-06-16 DIAGNOSIS — Z7984 Long term (current) use of oral hypoglycemic drugs: Secondary | ICD-10-CM | POA: Insufficient documentation

## 2016-06-16 LAB — RAPID STREP SCREEN (MED CTR MEBANE ONLY): Streptococcus, Group A Screen (Direct): NEGATIVE

## 2016-06-16 NOTE — ED Provider Notes (Signed)
HPI  SUBJECTIVE:  Sally Barnes is a 46 y.o. female who presents with sore throat for the past 2 days. She reports persistent nasal congestion, rhinorrhea postnasal drip that has been present for the past 3 weeks. She denies sinus pain or pressure, fevers. No ear pain. No GERD symptoms, allergy symptoms, muffled, hot potato voice, drooling, trismus. No abdominal pain, bodyaches, headaches, rash. No contacts for strep or mono although she has been taking care of her 2 young grandchildren. No difficulty breathing or sensation of throat swelling shut. She states that she does feel some swollen cervical lymph nodes. She has  tried warm water, mouthwash, Chloraseptic and Tylenol without much improvement in her symptoms. No aggravating factors. No antipyretic in the past 6-8 hours. She has a past medical history of fibromyalgia which is managed by the [facility], diabetes, hypertension, asthma, gastric bypass, GERD, strep. She is also a smoker. PND: Dr. Merrily Pew at [facility] family [facility].  Pt was seen here on 2/1 for cough, fever, nasal congestion and nausea, found to have bronchitis, prescribed Augmentin, prednisone. Advised to continue Nasonex and albuterol. Then she was also here on 2/12 for nasal congestion and dizziness, prescribed Tessalon, Flonase and Mucinex. patient states that she did not fill the Tessalon due to cost reasons and has not been taking the Flonase and Mucinex . She is taking Nasonex.   Past Medical History:  Diagnosis Date  . Anxiety   . Asthma   . Chronic pain   . Depression   . Diabetes (HCC)   . Fibromyalgia   . HTN (hypertension)   . Migraines   . Neuropathy (HCC)   . Osteoarthritis   . Sciatica     Past Surgical History:  Procedure Laterality Date  . APPENDECTOMY    . GASTRIC BYPASS    . OSTEOTOMY      No family history on file.  Social History  Substance Use Topics  . Smoking status: Current Every Day Smoker    Packs/day: 1.00    Types: Cigarettes   . Smokeless tobacco: Never Used  . Alcohol use Yes     Comment: social    No current facility-administered medications for this encounter.   Current Outpatient Prescriptions:  .  albuterol (PROVENTIL HFA;VENTOLIN HFA) 108 (90 Base) MCG/ACT inhaler, Inhale into the lungs every 6 (six) hours as needed for wheezing or shortness of breath., Disp: , Rfl:  .  amitriptyline (ELAVIL) 10 MG tablet, Take 10 mg by mouth at bedtime., Disp: , Rfl:  .  amLODipine (NORVASC) 10 MG tablet, Take 10 mg by mouth daily., Disp: , Rfl:  .  baclofen (LIORESAL) 10 MG tablet, Take 10 mg by mouth 3 (three) times daily., Disp: , Rfl:  .  DULoxetine (CYMBALTA) 30 MG capsule, Take 30 mg by mouth daily., Disp: , Rfl:  .  DULoxetine (CYMBALTA) 60 MG capsule, Take 60 mg by mouth daily., Disp: , Rfl:  .  fluticasone (FLONASE) 50 MCG/ACT nasal spray, Place 1 spray into both nostrils 2 (two) times daily., Disp: 15.8 g, Rfl: 2 .  lisinopril (PRINIVIL,ZESTRIL) 2.5 MG tablet, Take 2.5 mg by mouth daily., Disp: , Rfl:  .  metFORMIN (GLUCOPHAGE) 500 MG tablet, Take by mouth 2 (two) times daily with a meal., Disp: , Rfl:  .  mometasone (NASONEX) 50 MCG/ACT nasal spray, Place 2 sprays into the nose daily., Disp: , Rfl:  .  pregabalin (LYRICA) 200 MG capsule, Take 200 mg by mouth 3 (three) times  daily., Disp: , Rfl:  .  Tapentadol HCl (NUCYNTA) 100 MG TABS, Take 100 mg by mouth 2 (two) times daily., Disp: , Rfl:  .  tiZANidine (ZANAFLEX) 4 MG capsule, Take 4 mg by mouth 3 (three) times daily., Disp: , Rfl:  .  benzonatate (TESSALON) 100 MG capsule, Take 2 capsules (200 mg total) by mouth every 8 (eight) hours., Disp: 21 capsule, Rfl: 0  Allergies  Allergen Reactions  . Phenobarbital Other (See Comments)    hallucinations  . Sulfa Antibiotics     palpitations  . Tylenol With Codeine #3 [Acetaminophen-Codeine] Other (See Comments)    shakes  . Naprosyn [Naproxen] Palpitations     ROS  As noted in HPI.   Physical  Exam  BP 128/86 (BP Location: Left Arm)   Pulse (!) 102   Temp 98.6 F (37 C) (Oral)   Resp 16   Ht 5\' 7"  (1.702 m)   Wt 225 lb (102.1 kg)   LMP 05/21/2016 (Exact Date)   SpO2 99%   BMI 35.24 kg/m   Constitutional: Well developed, well nourished, no acute distress Eyes:  EOMI, conjunctiva normal bilaterally HENT: Normocephalic, atraumatic,mucus membranes moistAre TMs normal bilaterally. Mild nasal congestion. Normal turbinates. Positive erythematous oropharynx, tonsils erythematous, but normal size. Positive tonsillith on the left side. Uvula midline. Positive postnasal drip. Neck: Positive shotty cervical lymphadenopathy Respiratory: Normal inspiratory effort Cardiovascular: Normal rate regular rhythm no murmurs rubs or gallops  GI: nondistended soft, nontender, no splenomegaly  skin: No rash, skin intact Musculoskeletal: no deformities Neurologic: Alert & oriented x 3, no focal neuro deficits Psychiatric: Speech and behavior appropriate   ED Course   Medications - No data to display  Orders Placed This Encounter  Procedures  . Rapid strep screen    Standing Status:   Standing    Number of Occurrences:   1    Order Specific Question:   Patient immune status    Answer:   Normal  . Culture, group A strep    Standing Status:   Standing    Number of Occurrences:   1    Results for orders placed or performed during the hospital encounter of 06/16/16 (from the past 24 hour(s))  Rapid strep screen     Status: None   Collection Time: 06/16/16  5:37 PM  Result Value Ref Range   Streptococcus, Group A Screen (Direct) NEGATIVE NEGATIVE   No results found.  ED Clinical Impression  Sore throat  Post-nasal drip   ED Assessment/Plan  Previous records reviewed. As noted in history of present illness. We'll check rapid strep. Feel that her sore throat is most likely viral. If negative, supportive treatment with Benadryl/Maalox mixture, continue Tylenol. Advised saline  nasal irrigation and antihistamine of her choice such as Allegra, Claritin or Zyrtec for her postnasal drip. This also may be contributing to her sore throat. She is to continue the Nasonex. Follow-up with PMD as needed. Discussed labs, MDM, plan and followup with patient. Discussed sn/sx that should prompt return to the ED. Patient agrees with plan.   No orders of the defined types were placed in this encounter.   *This clinic note was created using Dragon dictation software. Therefore, there may be occasional mistakes despite careful proofreading.  ?   Domenick GongAshley Jaianna Nicoll, MD 06/16/16 [phone]

## 2016-06-16 NOTE — Discharge Instructions (Signed)
your rapid strep was negative today, so we have sent off a throat culture.  We will contact you and call in the appropriate antibiotics if your culture comes back positive for an infection requiring antibiotic treatment.  Give us a working phone number.   Tylenol as needed for pain.  Make sure you drink plenty of extra fluids.  Some people find salt water gargles and  Traditional Medicinal's "Throat Coat" tea helpful. Take 5 mL of liquid Benadryl and 5 mL of Maalox. Mix it together, and then hold it in your mouth for as long as you can and then swallow. You may do this 4 times a day.  Start some saline nasal irrigation with a neil med rinse or neti pot. Start some Allegra, Claritin or Zyrtec continue Nasonex. This will help with her postnasal drip and most likely your sore throat.  Go to www.goodrx.com to look up your medications. This will give you a list of where you can find your prescriptions at the most affordable prices.

## 2016-06-16 NOTE — ED Triage Notes (Signed)
Sore throat, cough, post nasal drainage.

## 2016-06-17 ENCOUNTER — Telehealth (HOSPITAL_COMMUNITY): Payer: Self-pay | Admitting: Internal Medicine

## 2016-06-17 ENCOUNTER — Telehealth: Payer: Self-pay

## 2016-06-17 LAB — CULTURE, GROUP A STREP (THRC)

## 2016-06-17 MED ORDER — PENICILLIN V POTASSIUM 500 MG PO TABS: 500.0000 mg | ORAL_TABLET | Freq: Two times a day (BID) | ORAL | 0 refills | Status: DC

## 2016-06-17 NOTE — Telephone Encounter (Signed)
Clinical staff, please let patient know that throat culture was positive for non group A strep.  Rx penicillin V sent to pharmacy of record, [facility] in [place].  Recheck or followup with PCP for further evaluation if symptoms are not improving.  LM

## 2016-06-17 NOTE — Telephone Encounter (Signed)
-----   Message from Eustace MooreLaura W Murray, MD sent at 06/17/2016 12:26 PM EST ----- Clinical staff, please let patient know that throat culture was positive for non group A strep.  Rx penicillin V sent to pharmacy of record, [facility] in [place].  Recheck or followup with PCP for further evaluation if symptoms are not improving.  LM

## 2016-06-22 ENCOUNTER — Telehealth: Payer: Self-pay

## 2016-06-22 MED ORDER — FLUCONAZOLE 150 MG PO TABS: ORAL_TABLET | ORAL | 0 refills | Status: DC

## 2016-07-06 DIAGNOSIS — S91209A Unspecified open wound of unspecified toe(s) with damage to nail, initial encounter: Secondary | ICD-10-CM | POA: Diagnosis not present

## 2016-07-06 DIAGNOSIS — L03031 Cellulitis of right toe: Secondary | ICD-10-CM | POA: Diagnosis not present

## 2016-07-06 DIAGNOSIS — J3089 Other allergic rhinitis: Secondary | ICD-10-CM | POA: Diagnosis not present

## 2016-07-06 DIAGNOSIS — T3695XA Adverse effect of unspecified systemic antibiotic, initial encounter: Secondary | ICD-10-CM | POA: Diagnosis not present

## 2016-07-06 DIAGNOSIS — B379 Candidiasis, unspecified: Secondary | ICD-10-CM | POA: Diagnosis not present

## 2016-07-06 DIAGNOSIS — E1169 Type 2 diabetes mellitus with other specified complication: Secondary | ICD-10-CM | POA: Diagnosis not present

## 2016-07-24 DIAGNOSIS — D72829 Elevated white blood cell count, unspecified: Secondary | ICD-10-CM | POA: Diagnosis not present

## 2016-07-24 DIAGNOSIS — E119 Type 2 diabetes mellitus without complications: Secondary | ICD-10-CM | POA: Diagnosis not present

## 2016-07-24 DIAGNOSIS — B373 Candidiasis of vulva and vagina: Secondary | ICD-10-CM | POA: Diagnosis not present

## 2016-07-24 DIAGNOSIS — M7989 Other specified soft tissue disorders: Secondary | ICD-10-CM | POA: Diagnosis not present

## 2016-07-24 DIAGNOSIS — K589 Irritable bowel syndrome without diarrhea: Secondary | ICD-10-CM | POA: Diagnosis not present

## 2016-07-24 DIAGNOSIS — G4700 Insomnia, unspecified: Secondary | ICD-10-CM | POA: Diagnosis not present

## 2016-07-24 DIAGNOSIS — R0602 Shortness of breath: Secondary | ICD-10-CM | POA: Diagnosis not present

## 2016-07-24 DIAGNOSIS — Z1389 Encounter for screening for other disorder: Secondary | ICD-10-CM | POA: Diagnosis not present

## 2016-07-24 DIAGNOSIS — G43809 Other migraine, not intractable, without status migrainosus: Secondary | ICD-10-CM | POA: Diagnosis not present

## 2016-07-26 DIAGNOSIS — M25461 Effusion, right knee: Secondary | ICD-10-CM | POA: Diagnosis not present

## 2016-07-26 DIAGNOSIS — M170 Bilateral primary osteoarthritis of knee: Secondary | ICD-10-CM | POA: Diagnosis not present

## 2016-07-26 DIAGNOSIS — M25561 Pain in right knee: Secondary | ICD-10-CM | POA: Diagnosis not present

## 2016-07-26 DIAGNOSIS — M25462 Effusion, left knee: Secondary | ICD-10-CM | POA: Diagnosis not present

## 2016-07-26 DIAGNOSIS — M174 Other bilateral secondary osteoarthritis of knee: Secondary | ICD-10-CM | POA: Diagnosis not present

## 2016-07-26 DIAGNOSIS — M25562 Pain in left knee: Secondary | ICD-10-CM | POA: Diagnosis not present

## 2016-07-27 DIAGNOSIS — M25561 Pain in right knee: Secondary | ICD-10-CM | POA: Diagnosis not present

## 2016-07-27 DIAGNOSIS — M25562 Pain in left knee: Secondary | ICD-10-CM | POA: Diagnosis not present

## 2016-07-27 DIAGNOSIS — M174 Other bilateral secondary osteoarthritis of knee: Secondary | ICD-10-CM | POA: Diagnosis not present

## 2016-08-27 DIAGNOSIS — M1712 Unilateral primary osteoarthritis, left knee: Secondary | ICD-10-CM | POA: Diagnosis not present

## 2016-09-12 DIAGNOSIS — M545 Low back pain: Secondary | ICD-10-CM | POA: Diagnosis not present

## 2016-09-12 DIAGNOSIS — G894 Chronic pain syndrome: Secondary | ICD-10-CM | POA: Diagnosis not present

## 2016-09-12 DIAGNOSIS — M25512 Pain in left shoulder: Secondary | ICD-10-CM | POA: Diagnosis not present

## 2016-09-12 DIAGNOSIS — M542 Cervicalgia: Secondary | ICD-10-CM | POA: Diagnosis not present

## 2016-09-12 DIAGNOSIS — M791 Myalgia: Secondary | ICD-10-CM | POA: Diagnosis not present

## 2016-09-12 DIAGNOSIS — Z79891 Long term (current) use of opiate analgesic: Secondary | ICD-10-CM | POA: Diagnosis not present

## 2016-10-14 ENCOUNTER — Encounter: Payer: Self-pay | Admitting: Gynecology

## 2016-10-14 ENCOUNTER — Ambulatory Visit
Admission: EM | Admit: 2016-10-14 | Discharge: 2016-10-14 | Disposition: A | Discharge status: Home / Self Care | Payer: Medicare Other | Attending: Emergency Medicine | Admitting: Emergency Medicine

## 2016-10-14 ENCOUNTER — Ambulatory Visit: Payer: Medicare Other

## 2016-10-14 DIAGNOSIS — F17210 Nicotine dependence, cigarettes, uncomplicated: Secondary | ICD-10-CM | POA: Insufficient documentation

## 2016-10-14 DIAGNOSIS — J45909 Unspecified asthma, uncomplicated: Secondary | ICD-10-CM | POA: Diagnosis not present

## 2016-10-14 DIAGNOSIS — Z8679 Personal history of other diseases of the circulatory system: Secondary | ICD-10-CM | POA: Diagnosis not present

## 2016-10-14 DIAGNOSIS — Z882 Allergy status to sulfonamides status: Secondary | ICD-10-CM | POA: Diagnosis not present

## 2016-10-14 DIAGNOSIS — I10 Essential (primary) hypertension: Secondary | ICD-10-CM | POA: Diagnosis not present

## 2016-10-14 DIAGNOSIS — Z888 Allergy status to other drugs, medicaments and biological substances status: Secondary | ICD-10-CM | POA: Insufficient documentation

## 2016-10-14 DIAGNOSIS — S99922A Unspecified injury of left foot, initial encounter: Secondary | ICD-10-CM | POA: Diagnosis not present

## 2016-10-14 DIAGNOSIS — F419 Anxiety disorder, unspecified: Secondary | ICD-10-CM | POA: Diagnosis not present

## 2016-10-14 DIAGNOSIS — M79672 Pain in left foot: Secondary | ICD-10-CM | POA: Diagnosis not present

## 2016-10-14 DIAGNOSIS — S93602A Unspecified sprain of left foot, initial encounter: Secondary | ICD-10-CM | POA: Diagnosis not present

## 2016-10-14 DIAGNOSIS — G8929 Other chronic pain: Secondary | ICD-10-CM | POA: Diagnosis not present

## 2016-10-14 DIAGNOSIS — F329 Major depressive disorder, single episode, unspecified: Secondary | ICD-10-CM | POA: Insufficient documentation

## 2016-10-14 DIAGNOSIS — M797 Fibromyalgia: Secondary | ICD-10-CM | POA: Diagnosis not present

## 2016-10-14 DIAGNOSIS — Z79899 Other long term (current) drug therapy: Secondary | ICD-10-CM | POA: Insufficient documentation

## 2016-10-14 DIAGNOSIS — Z7984 Long term (current) use of oral hypoglycemic drugs: Secondary | ICD-10-CM | POA: Insufficient documentation

## 2016-10-14 DIAGNOSIS — X501XXA Overexertion from prolonged static or awkward postures, initial encounter: Secondary | ICD-10-CM | POA: Insufficient documentation

## 2016-10-14 DIAGNOSIS — M1990 Unspecified osteoarthritis, unspecified site: Secondary | ICD-10-CM | POA: Diagnosis not present

## 2016-10-14 DIAGNOSIS — S99912A Unspecified injury of left ankle, initial encounter: Secondary | ICD-10-CM | POA: Diagnosis not present

## 2016-10-14 DIAGNOSIS — Z885 Allergy status to narcotic agent status: Secondary | ICD-10-CM | POA: Insufficient documentation

## 2016-10-14 DIAGNOSIS — M25572 Pain in left ankle and joints of left foot: Secondary | ICD-10-CM | POA: Diagnosis not present

## 2016-10-14 DIAGNOSIS — S93402A Sprain of unspecified ligament of left ankle, initial encounter: Secondary | ICD-10-CM | POA: Diagnosis not present

## 2016-10-14 DIAGNOSIS — E1140 Type 2 diabetes mellitus with diabetic neuropathy, unspecified: Secondary | ICD-10-CM | POA: Insufficient documentation

## 2016-10-14 DIAGNOSIS — W19XXXA Unspecified fall, initial encounter: Secondary | ICD-10-CM | POA: Diagnosis not present

## 2016-10-14 NOTE — ED Provider Notes (Signed)
CSN: [id number]     Arrival date & time 10/14/16  1133 History   First MD Initiated Contact with Patient 10/14/16 1224     Chief Complaint  Patient presents with  . Fall   (Consider location/radiation/quality/duration/timing/severity/associated sxs/prior Treatment) 46 yr old AA female states she was trying to get out of bed and stepped on trundle aspect of bed and twisted left foot/ankle while grabbing door knob. + TTP of left foot ankle, able to bear wt. Pt reports hx of fibromyalgia and chronic pain management.    The history is provided by the patient. No language interpreter was used.    Past Medical History:  Diagnosis Date  . Anxiety   . Asthma   . Chronic pain   . Depression   . Diabetes (HCC)   . Fibromyalgia   . HTN (hypertension)   . Migraines   . Neuropathy   . Osteoarthritis   . Sciatica    Past Surgical History:  Procedure Laterality Date  . APPENDECTOMY    . GASTRIC BYPASS    . OSTEOTOMY     No family history on file. Social History  Substance Use Topics  . Smoking status: Current Every Day Smoker    Packs/day: 1.00    Types: Cigarettes  . Smokeless tobacco: Never Used  . Alcohol use Yes     Comment: social   OB History    No data available     Review of Systems  Constitutional: Negative.   HENT: Negative.   Eyes: Negative.   Respiratory: Negative.   Cardiovascular: Negative.   Gastrointestinal: Negative.   Endocrine: Negative.   Genitourinary: Negative.   Musculoskeletal: Positive for arthralgias, gait problem and joint swelling.  Skin: Negative.   Allergic/Immunologic: Negative.   Hematological: Negative.   Psychiatric/Behavioral: Negative.   All other systems reviewed and are negative.   Allergies  Phenobarbital; Sulfa antibiotics; Tylenol with codeine #3 [acetaminophen-codeine]; and Naprosyn [naproxen]  Home Medications   Prior to Admission medications   Medication Sig Start Date End Date Taking? Authorizing Provider  albuterol  (PROVENTIL HFA;VENTOLIN HFA) 108 (90 Base) MCG/ACT inhaler Inhale into the lungs every 6 (six) hours as needed for wheezing or shortness of breath.   Yes [provider]  amitriptyline (ELAVIL) 10 MG tablet Take 10 mg by mouth at bedtime.   Yes [provider]  amLODipine (NORVASC) 10 MG tablet Take 10 mg by mouth daily.   Yes [provider]  baclofen (LIORESAL) 10 MG tablet Take 10 mg by mouth 3 (three) times daily.   Yes [provider]  DULoxetine (CYMBALTA) 30 MG capsule Take 30 mg by mouth daily.   Yes [provider]  DULoxetine (CYMBALTA) 60 MG capsule Take 60 mg by mouth daily.   Yes [provider]  fluconazole (DIFLUCAN) 150 MG tablet Take on Day one and repeat in one week if needed. 06/22/16  Yes Hassan RowanWade, Eugene, MD  fluticasone [facility](FLONASE) 50 MCG/ACT nasal spray Place 1 spray into both nostrils 2 (two) times daily. 06/04/16  Yes Arnaldo Nataliamond, Michael S, MD  lisinopril (PRINIVIL,ZESTRIL) 2.5 MG tablet Take 2.5 mg by mouth daily.   Yes [provider]  metFORMIN (GLUCOPHAGE) 500 MG tablet Take by mouth 2 (two) times daily with a meal.   Yes [provider]  mometasone (NASONEX) 50 MCG/ACT nasal spray Place 2 sprays into the nose daily.   Yes [provider]  pregabalin (LYRICA) 200 MG capsule Take 200 mg by mouth 3 (three)  times daily.   Yes [provider]  Tapentadol HCl (NUCYNTA) 100 MG TABS Take 100 mg by mouth 2 (two) times daily.   Yes [provider]  tiZANidine (ZANAFLEX) 4 MG capsule Take 4 mg by mouth 3 (three) times daily.   Yes [provider]  benzonatate (TESSALON) 100 MG capsule Take 2 capsules (200 mg total) by mouth every 8 (eight) hours. 06/04/16   Arnaldo Natal, MD  penicillin v potassium (VEETID) 500 MG tablet Take 1 tablet (500 mg total) by mouth 2 (two) times daily. 06/17/16   Eustace Moore, MD   Meds Ordered and Administered this Visit  Medications - No data to  display  BP 132/82 (BP Location: Left Arm)   Pulse 92   Temp 98.8 F (37.1 C) (Oral)   Resp 16   Ht 5\' 7"  (1.702 m)   Wt 226 lb (102.5 kg)   LMP 09/08/2016 Comment: marina  SpO2 100%   BMI 35.40 kg/m  No data found.   Physical Exam  Constitutional: She is oriented to person, place, and time. She appears well-developed and well-nourished. She is active and cooperative.  Non-toxic appearance. She does not have a sickly appearance. She does not appear ill. No distress.  HENT:  Head: Normocephalic.  Right Ear: Tympanic membrane normal.  Left Ear: Tympanic membrane normal.  Nose: Nose normal.  Mouth/Throat: Uvula is midline and mucous membranes are normal.  Eyes: Pupils are equal, round, and reactive to light.  Neck: Trachea normal and normal range of motion. Muscular tenderness present. No Brudzinski's sign and no Kernig's sign noted.  Cardiovascular: Normal rate and regular rhythm.   Pulses:      Dorsalis pedis pulses are 2+ on the right side, and 2+ on the left side.  Pulmonary/Chest: Effort normal and breath sounds normal.  Musculoskeletal:       Left ankle: She exhibits swelling. She exhibits normal range of motion, no ecchymosis, no deformity, no laceration and normal pulse. Tenderness. Lateral malleolus tenderness found. No medial malleolus, no AITFL, no CF ligament, no posterior TFL, no head of 5th metatarsal and no proximal fibula tenderness found.       Left foot: There is tenderness and bony tenderness. There is normal range of motion, no swelling, normal capillary refill, no crepitus, no deformity and no laceration.  Neurological: She is alert and oriented to person, place, and time. She has normal strength. No sensory deficit. GCS eye subscore is 4. GCS verbal subscore is 5. GCS motor subscore is 6.  Skin: Skin is warm and dry. No rash noted.  Psychiatric: She has a normal mood and affect. Her speech is normal and behavior is normal.    Urgent Care Course      Procedures (including critical care time)  Labs Review Labs Reviewed - No data to display  Imaging Review Dg Ankle Complete Left  Result Date: 10/14/2016 CLINICAL DATA:  Left lateral ankle pain and left foot pain 2 days after a fall. EXAM: LEFT ANKLE COMPLETE - 3+ VIEW COMPARISON:  None. FINDINGS: There is no evidence of fracture, dislocation, or joint effusion. There is no evidence of arthropathy or other focal bone abnormality. Soft tissues are unremarkable. IMPRESSION: Negative. Electronically Signed   By: Francene Boyers M.D.   On: 10/14/2016 13:11   Dg Foot Complete Left  Result Date: 10/14/2016 CLINICAL DATA:  Pain after fall. EXAM: LEFT FOOT - COMPLETE 3+ VIEW COMPARISON:  None. FINDINGS: There is no evidence of fracture  or dislocation. There is no evidence of arthropathy or other focal bone abnormality. Soft tissues are unremarkable. IMPRESSION: Negative. Electronically Signed   By: Gerome Sam III M.D   On: 10/14/2016 13:12          MDM   1. Fall, initial encounter   2. Foot sprain, left, initial encounter   3. Sprain of left ankle, unspecified ligament, initial encounter   4. Hx of essential hypertension     1345: ACE WRAP TO LEFT ANKLE/FOOT. DISCUSSED PLAN OF CARE WITH PT: NV intact pre/psot ace application. Pt has walker cane. YOUR XRAYS WERE NEGATIVE FOR FRACTURE. REST, ICE,ELEVATE, WEAR ACE WRAP. FOLLOW UP WITH PCP IN 1 WEEK IF SYMPTOMS DO NOT IMPROVE MAY NEED REFERRAL TO ORTHOPEDICS. RETURN TO UC AS NEEDED. CONTINUE HOME PAIN MEDS AS DIRECTED.    Clancy Gourd, NP 10/14/16 1552

## 2016-10-14 NOTE — Discharge Instructions (Addendum)
YOUR XRAYS WERE NEGATIVE FOR FRACTURE. REST, ICE,ELEVATE, WEAR ACE WRAP. FOLLOW UP WITH PCP IN 1 WEEK IF SYMPTOMS DO NOT IMPROVE MAY NEED REFERRAL TO ORTHOPEDICS. RETURN TO UC AS NEEDED. CONTINUE HOME PAIN MEDS AS DIRECTED.

## 2016-10-14 NOTE — ED Triage Notes (Signed)
Per patient fell x 2 days ago. Per patient left ankle twisted while reach for the door knob.

## 2016-10-19 ENCOUNTER — Ambulatory Visit
Admission: EM | Admit: 2016-10-19 | Discharge: 2016-10-19 | Disposition: A | Discharge status: Home / Self Care | Payer: Medicare Other | Attending: Family Medicine | Admitting: Family Medicine

## 2016-10-19 ENCOUNTER — Encounter: Payer: Self-pay | Admitting: Emergency Medicine

## 2016-10-19 DIAGNOSIS — M25572 Pain in left ankle and joints of left foot: Secondary | ICD-10-CM | POA: Diagnosis not present

## 2016-10-19 MED ORDER — KETOROLAC TROMETHAMINE 60 MG/2ML IM SOLN
60.0000 mg | Freq: Once | INTRAMUSCULAR | Status: AC
  Administered 2016-10-19: 60 mg via INTRAMUSCULAR

## 2016-10-19 NOTE — ED Provider Notes (Signed)
[facility]    CSN: [id number] Arrival date & time: 10/19/16  1214     History   Chief Complaint Chief Complaint  Patient presents with  . Ankle Pain    left    HPI Sally Barnes is a 46 y.o. female.   Patient is a 46 year old black female was had a history of turning her left ankle last Sunday. The injury was significant she came here had x-rays of her ankle foot which both were negative. Since the injury she is having difficulty ambulating with constant and recurrent pain in the left ankle foot. They put 8 over the foot and that she states this is not sufficient. She still hurting still having difficulty ambulating still in pain she states 9 out of 10. She states this is not new she's had multiple injuries of her ankle before and states that when she fell Sunday she heard a popping sensation. She has back issues and back pain she is on a pain management contract which she willingly talks about shares she is also requesting some Toradol injection because she states the pain right now is a 9 out of 10   The history is provided by the patient. No language interpreter was used.  Ankle Pain  Location:  Ankle and foot Time since incident:  6 days Injury: yes   Ankle location:  L ankle Foot location:  L foot Pain details:    Quality:  Aching, shooting, throbbing and tingling   Radiates to:  Does not radiate   Severity:  Severe   Timing:  Constant   Progression:  Worsening Chronicity:  New Dislocation: no   Foreign body present:  No foreign bodies   Past Medical History:  Diagnosis Date  . Anxiety   . Asthma   . Chronic pain   . Depression   . Diabetes (HCC)   . Fibromyalgia   . HTN (hypertension)   . Migraines   . Neuropathy   . Osteoarthritis   . Sciatica     Patient Active Problem List   Diagnosis Date Noted  . Fall 10/14/2016  . Foot sprain, left, initial encounter 10/14/2016  . Sprain of left ankle 10/14/2016    Past Surgical History:    Procedure Laterality Date  . APPENDECTOMY    . GASTRIC BYPASS    . OSTEOTOMY      OB History    No data available       Home Medications    Prior to Admission medications   Medication Sig Start Date End Date Taking? Authorizing Provider  albuterol (PROVENTIL HFA;VENTOLIN HFA) 108 (90 Base) MCG/ACT inhaler Inhale into the lungs every 6 (six) hours as needed for wheezing or shortness of breath.    [provider]  amitriptyline (ELAVIL) 10 MG tablet Take 10 mg by mouth at bedtime.    [provider]  amLODipine (NORVASC) 10 MG tablet Take 10 mg by mouth daily.    [provider]  baclofen (LIORESAL) 10 MG tablet Take 10 mg by mouth 3 (three) times daily.    [provider]  benzonatate (TESSALON) 100 MG capsule Take 2 capsules (200 mg total) by mouth every 8 (eight) hours. 06/04/16   Arnaldo Nataliamond, Michael S, MD  DULoxetine (CYMBALTA) 30 MG capsule Take 30 mg by mouth daily.    [provider]  DULoxetine (CYMBALTA) 60 MG capsule Take 60 mg by mouth daily.    [provider]  fluconazole (DIFLUCAN) 150 MG tablet  Take on Day one and repeat in one week if needed. 06/22/16   Hassan Rowan, MD  fluticasone (FLONASE) 50 MCG/ACT nasal spray Place 1 spray into both nostrils 2 (two) times daily. 06/04/16   Arnaldo Natal, MD  lisinopril (PRINIVIL,ZESTRIL) 2.5 MG tablet Take 2.5 mg by mouth daily.    [provider]  metFORMIN (GLUCOPHAGE) 500 MG tablet Take by mouth 2 (two) times daily with a meal.    [provider]  mometasone (NASONEX) 50 MCG/ACT nasal spray Place 2 sprays into the nose daily.    [provider]  penicillin v potassium (VEETID) 500 MG tablet Take 1 tablet (500 mg total) by mouth 2 (two) times daily. 06/17/16   Eustace Moore, MD  pregabalin (LYRICA) 200 MG capsule Take 200 mg by mouth 3 (three) times daily.    [provider]  Tapentadol HCl (NUCYNTA) 100 MG TABS Take 100 mg by mouth 2 (two)  times daily.    [provider]  tiZANidine (ZANAFLEX) 4 MG capsule Take 4 mg by mouth 3 (three) times daily.    [provider]    Family History History reviewed. No pertinent family history.  Social History Social History  Substance Use Topics  . Smoking status: Current Every Day Smoker    Packs/day: 1.00    Types: Cigarettes  . Smokeless tobacco: Never Used  . Alcohol use Yes     Comment: social     Allergies   Phenobarbital; Sulfa antibiotics; Tylenol with codeine #3 [acetaminophen-codeine]; and Naprosyn [naproxen]   Review of Systems Review of Systems  Musculoskeletal: Positive for gait problem and myalgias.  All other systems reviewed and are negative.    Physical Exam Triage Vital Signs ED Triage Vitals  Enc Vitals Group     BP 10/19/16 1239 (!) 144/93     Pulse Rate 10/19/16 1239 (!) 16     Resp 10/19/16 1239 16     Temp 10/19/16 1239 98.8 F (37.1 C)     Temp Source 10/19/16 1239 Oral     SpO2 10/19/16 1239 97 %     Weight 10/19/16 1238 225 lb (102.1 kg)     Height 10/19/16 1238 5\' 7"  (1.702 m)     Head Circumference --      Peak Flow --      Pain Score 10/19/16 1238 9     Pain Loc --      Pain Edu? --      Excl. in GC? --    No data found.   Updated Vital Signs BP (!) 144/93 (BP Location: Left Arm)   Pulse (!) 16   Temp 98.8 F (37.1 C) (Oral)   Resp 16   Ht 5\' 7"  (1.702 m)   Wt 225 lb (102.1 kg)   LMP 09/07/2016 (Approximate)   SpO2 97%   BMI 35.24 kg/m   Visual Acuity Right Eye Distance:   Left Eye Distance:   Bilateral Distance:    Right Eye Near:   Left Eye Near:    Bilateral Near:     Physical Exam  Constitutional: She is oriented to person, place, and time. She appears well-developed and well-nourished.  HENT:  Head: Normocephalic and atraumatic.  Right Ear: External ear normal.  Left Ear: External ear normal.  Eyes: Pupils are equal, round, and reactive to light.  Neck: Normal range of motion. Neck  supple.  Pulmonary/Chest: Effort normal.  Musculoskeletal: She exhibits tenderness.  Left foot: There is tenderness, bony tenderness and swelling. There is no deformity and no laceration.       Feet:  Patient with pain in the left foot left ankle area over the fourth and fifth metatarsal bones  Neurological: She is alert and oriented to person, place, and time.  Skin: Skin is warm.  Psychiatric: She has a normal mood and affect.  Vitals reviewed.    UC Treatments / Results  Labs (all labs ordered are listed, but only abnormal results are displayed) Labs Reviewed - No data to display  EKG  EKG Interpretation None       Radiology No results found.  Procedures Procedures (including critical care time)  Medications Ordered in UC Medications  ketorolac (TORADOL) injection 60 mg (not administered)     Initial Impression / Assessment and Plan / UC Course  I have reviewed the triage vital signs and the nursing notes.  Pertinent labs & imaging results that were available during my care of the patient were reviewed by me and considered in my medical decision making (see chart for details).   because of mild tenderness she's having over the left foot left ankle and actually pop was felt on palpation of the left foot but will go ahead and place on a cam boot and will give a 60 Toradol request recommend cool ice to the foot and ankle follow-up with PCP in 2-3 weeks not better    Final Clinical Impressions(s) / UC Diagnoses   Final diagnoses:  Pain of joint of left ankle and foot    New Prescriptions New Prescriptions   No medications on file    Note: This dictation was prepared with Dragon dictation along with smaller phrase technology. Any transcriptional errors that result from this process are unintentional.   Hassan Rowan, MD 10/19/16 1309

## 2016-10-19 NOTE — Discharge Instructions (Signed)
Would recommend Cryotherapy instead of heat. Follow-up with PCP if not better in a week would wear the boot whenever emanating from 1-2 weeks.

## 2016-10-19 NOTE — ED Triage Notes (Signed)
Patient states that she had an ankle x-ray done on Sunday.  Patient reports ongoing pain in her left ankle.  Patient seen here on Sunday for left ankle injury.

## 2016-11-09 DIAGNOSIS — I10 Essential (primary) hypertension: Secondary | ICD-10-CM | POA: Diagnosis not present

## 2016-11-09 DIAGNOSIS — G4700 Insomnia, unspecified: Secondary | ICD-10-CM | POA: Diagnosis not present

## 2016-11-09 DIAGNOSIS — E119 Type 2 diabetes mellitus without complications: Secondary | ICD-10-CM | POA: Diagnosis not present

## 2016-11-09 DIAGNOSIS — G43809 Other migraine, not intractable, without status migrainosus: Secondary | ICD-10-CM | POA: Diagnosis not present

## 2016-11-09 DIAGNOSIS — M79672 Pain in left foot: Secondary | ICD-10-CM | POA: Diagnosis not present

## 2016-11-09 DIAGNOSIS — K589 Irritable bowel syndrome without diarrhea: Secondary | ICD-10-CM | POA: Diagnosis not present

## 2016-11-09 DIAGNOSIS — G894 Chronic pain syndrome: Secondary | ICD-10-CM | POA: Diagnosis not present

## 2016-11-09 DIAGNOSIS — B373 Candidiasis of vulva and vagina: Secondary | ICD-10-CM | POA: Diagnosis not present

## 2016-11-16 DIAGNOSIS — M2012 Hallux valgus (acquired), left foot: Secondary | ICD-10-CM | POA: Diagnosis not present

## 2016-11-16 DIAGNOSIS — M25472 Effusion, left ankle: Secondary | ICD-10-CM | POA: Diagnosis not present

## 2016-11-16 DIAGNOSIS — M79672 Pain in left foot: Secondary | ICD-10-CM | POA: Diagnosis not present

## 2016-11-16 DIAGNOSIS — M25572 Pain in left ankle and joints of left foot: Secondary | ICD-10-CM | POA: Diagnosis not present

## 2016-12-05 ENCOUNTER — Ambulatory Visit
Admission: EM | Admit: 2016-12-05 | Discharge: 2016-12-05 | Disposition: A | Discharge status: Home / Self Care | Payer: Medicare Other | Attending: Family Medicine | Admitting: Family Medicine

## 2016-12-05 DIAGNOSIS — Z23 Encounter for immunization: Secondary | ICD-10-CM

## 2016-12-05 DIAGNOSIS — S61210A Laceration without foreign body of right index finger without damage to nail, initial encounter: Secondary | ICD-10-CM

## 2016-12-05 DIAGNOSIS — W268XXA Contact with other sharp object(s), not elsewhere classified, initial encounter: Secondary | ICD-10-CM

## 2016-12-05 MED ORDER — AMOXICILLIN 875 MG PO TABS: 875.0000 mg | ORAL_TABLET | Freq: Two times a day (BID) | ORAL | 0 refills | Status: DC

## 2016-12-05 MED ORDER — LIDOCAINE-EPINEPHRINE-TETRACAINE (LET) SOLUTION
3.0000 mL | Freq: Once | NASAL | Status: AC
  Administered 2016-12-05: 13:00:00 3 mL via TOPICAL

## 2016-12-05 MED ORDER — TETANUS-DIPHTH-ACELL PERTUSSIS 5-2.5-18.5 LF-MCG/0.5 IM SUSP
0.5000 mL | Freq: Once | INTRAMUSCULAR | Status: AC
  Administered 2016-12-05: 0.5 mL via INTRAMUSCULAR

## 2016-12-05 NOTE — ED Notes (Signed)
Applied LET to laceration.

## 2016-12-05 NOTE — Discharge Instructions (Signed)
Follow up in 8 days for suture removal.

## 2016-12-05 NOTE — ED Notes (Signed)
Pt right index finger flushed with normal saline and surgical scrub.

## 2016-12-05 NOTE — ED Provider Notes (Signed)
[facility]    CSN: [id number] Arrival date & time: 12/05/16  1139     History   Chief Complaint Chief Complaint  Patient presents with  . Laceration    Right Index Finger    HPI Sally Barnes is a 46 y.o. female.   The history is provided by the patient.  Laceration  Location:  Finger Finger laceration location:  R index finger Length:  1.5cm Depth:  Cutaneous Quality: straight   Bleeding: venous   Time since incident:  30 minutes Laceration mechanism:  Metal edge (cut finger opening a can of Spam) Pain details:    Quality:  Aching Foreign body present:  No foreign bodies Relieved by:  None tried Tetanus status:  Unknown Associated symptoms: no fever, no focal weakness, no numbness, no rash, no redness, no swelling and no streaking     Past Medical History:  Diagnosis Date  . Anxiety   . Asthma   . Chronic pain   . Depression   . Diabetes (HCC)   . Fibromyalgia   . HTN (hypertension)   . Migraines   . Neuropathy   . Osteoarthritis   . Sciatica     Patient Active Problem List   Diagnosis Date Noted  . Fall 10/14/2016  . Foot sprain, left, initial encounter 10/14/2016  . Sprain of left ankle 10/14/2016    Past Surgical History:  Procedure Laterality Date  . APPENDECTOMY    . GASTRIC BYPASS    . OSTEOTOMY      OB History    No data available       Home Medications    Prior to Admission medications   Medication Sig Start Date End Date Taking? Authorizing Provider  albuterol (PROVENTIL HFA;VENTOLIN HFA) 108 (90 Base) MCG/ACT inhaler Inhale into the lungs every 6 (six) hours as needed for wheezing or shortness of breath.   Yes [provider]  amitriptyline (ELAVIL) 10 MG tablet Take 10 mg by mouth at bedtime.   Yes [provider]  amLODipine (NORVASC) 10 MG tablet Take 10 mg by mouth daily.   Yes [provider]  baclofen (LIORESAL) 10 MG tablet Take 10 mg by mouth 3 (three) times daily.   Yes  [provider]  DULoxetine (CYMBALTA) 30 MG capsule Take 30 mg by mouth daily.   Yes [provider]  DULoxetine (CYMBALTA) 60 MG capsule Take 60 mg by mouth daily.   Yes [provider]  fluconazole (DIFLUCAN) 150 MG tablet Take on Day one and repeat in one week if needed. 06/22/16  Yes Hassan RowanWade, Eugene, MD  lisinopril (PRINIVIL,ZESTRIL) 2.5 MG tablet Take 2.5 mg by mouth daily.   Yes [provider]  metFORMIN (GLUCOPHAGE) 500 MG tablet Take by mouth 2 (two) times daily with a meal.   Yes [provider]  mometasone (NASONEX) 50 MCG/ACT nasal spray Place 2 sprays into the nose daily.   Yes [provider]  penicillin v potassium (VEETID) 500 MG tablet Take 1 tablet (500 mg total) by mouth 2 (two) times daily. 06/17/16  Yes Eustace MooreMurray, Laura W, MD  pregabalin (LYRICA) 200 MG capsule Take 200 mg by mouth 3 (three) times daily.   Yes [provider]  Tapentadol HCl (NUCYNTA) 100 MG TABS Take 100 mg by mouth 2 (two) times daily.   Yes [provider]  tiZANidine (ZANAFLEX) 4 MG capsule Take 4 mg by mouth 3 (three) times daily.   Yes [provider]  amoxicillin (AMOXIL) 875 MG tablet Take 1 tablet (875 mg total) by mouth 2 (two) times daily. 12/05/16   Payton Mccallum, MD    Family History History reviewed. No pertinent family history.  Social History Social History  Substance Use Topics  . Smoking status: Current Every Day Smoker    Packs/day: 1.00    Types: Cigarettes  . Smokeless tobacco: Never Used  . Alcohol use Yes     Comment: social     Allergies   Phenobarbital; Sulfa antibiotics; Tylenol with codeine #3 [acetaminophen-codeine]; and Naprosyn [naproxen]   Review of Systems Review of Systems  Constitutional: Negative for fever.  Skin: Negative for rash.  Neurological: Negative for focal weakness.     Physical Exam Triage Vital Signs ED Triage Vitals  Enc Vitals Group     BP 12/05/16 1155 133/60      Pulse Rate 12/05/16 1155 99     Resp 12/05/16 1155 18     Temp 12/05/16 1155 98.5 F (36.9 C)     Temp Source 12/05/16 1155 Oral     SpO2 12/05/16 1155 98 %     Weight 12/05/16 1154 225 lb (102.1 kg)     Height 12/05/16 1154 5\' 7"  (1.702 m)     Head Circumference --      Peak Flow --      Pain Score 12/05/16 1154 9     Pain Loc --      Pain Edu? --      Excl. in GC? --    No data found.   Updated Vital Signs BP 133/60 (BP Location: Left Arm)   Pulse 99   Temp 98.5 F (36.9 C) (Oral)   Resp 18   Ht 5\' 7"  (1.702 m)   Wt 225 lb (102.1 kg)   SpO2 98%   BMI 35.24 kg/m   Visual Acuity Right Eye Distance:   Left Eye Distance:   Bilateral Distance:    Right Eye Near:   Left Eye Near:    Bilateral Near:     Physical Exam   UC Treatments / Results  Labs (all labs ordered are listed, but only abnormal results are displayed) Labs Reviewed - No data to display  EKG  EKG Interpretation None       Radiology No results found.  Procedures .Marland KitchenLaceration Repair Date/Time: 12/05/2016 2:09 PM Performed by: Payton Mccallum Authorized by: Payton Mccallum   Consent:    Consent obtained:  Verbal   Consent given by:  Patient   Risks discussed:  Infection, need for additional repair, nerve damage, pain, poor cosmetic result, poor wound healing, retained foreign body, tendon damage and vascular damage   Alternatives discussed:  No treatment Anesthesia (see MAR for exact dosages):    Anesthesia method:  Topical application and local infiltration   Topical anesthetic:  LET   Local anesthetic:  Lidocaine 1% w/o epi Laceration details:    Location:  Finger   Finger location:  R index finger   Length (cm):  1.5 Repair type:    Repair type:  Simple Pre-procedure details:    Preparation:  Patient was prepped and draped in usual sterile fashion and imaging obtained to evaluate for foreign bodies Exploration:    Hemostasis achieved with:  Direct pressure   Wound exploration:  wound explored through full range of motion and entire depth of wound probed and visualized     Wound extent: areolar tissue violated     Wound extent: no fascia violation  noted, no foreign bodies/material noted, no muscle damage noted, no nerve damage noted, no tendon damage noted, no underlying fracture noted and no vascular damage noted     Contaminated: no   Treatment:    Area cleansed with:  Hibiclens   Amount of cleaning:  Standard   Irrigation solution:  Sterile saline   Irrigation method:  Syringe   Foreign body removal: no foreign material visualized.   Skin repair:    Repair method:  Sutures   Suture size:  6-0   Suture material:  Nylon   Suture technique:  Simple interrupted   Number of sutures:  4 Approximation:    Approximation:  Close Post-procedure details:    Dressing:  Antibiotic ointment   Patient tolerance of procedure:  Tolerated well, no immediate complications   (including critical care time)  Medications Ordered in UC Medications  Tdap (BOOSTRIX) injection 0.5 mL (0.5 mLs Intramuscular Given 12/05/16 1214)  lidocaine-EPINEPHrine-tetracaine (LET) solution (3 mLs Topical Given 12/05/16 1235)     Initial Impression / Assessment and Plan / UC Course  I have reviewed the triage vital signs and the nursing notes.  Pertinent labs & imaging results that were available during my care of the patient were reviewed by me and considered in my medical decision making (see chart for details).      Final Clinical Impressions(s) / UC Diagnoses   Final diagnoses:  Laceration of right index finger without foreign body without damage to nail, initial encounter    New Prescriptions Discharge Medication List as of 12/05/2016  1:21 PM    START taking these medications   Details  amoxicillin (AMOXIL) 875 MG tablet Take 1 tablet (875 mg total) by mouth 2 (two) times daily., Starting Wed 12/05/2016, Normal       1. diagnosis reviewed with patient 2. Laceration repair  as above 3. Tetanus vaccine given 4. Follow-up in 8 days for suture   Controlled Substance Prescriptions  Controlled Substance Registry consulted? Not Applicable   Payton Mccallum, MD 12/05/16 1415

## 2016-12-05 NOTE — ED Triage Notes (Signed)
Pt cut her right index finger about 15 minutes ago with a Spam can

## 2016-12-06 DIAGNOSIS — M542 Cervicalgia: Secondary | ICD-10-CM | POA: Diagnosis not present

## 2016-12-06 DIAGNOSIS — M25512 Pain in left shoulder: Secondary | ICD-10-CM | POA: Diagnosis not present

## 2016-12-06 DIAGNOSIS — M791 Myalgia: Secondary | ICD-10-CM | POA: Diagnosis not present

## 2016-12-06 DIAGNOSIS — Z79891 Long term (current) use of opiate analgesic: Secondary | ICD-10-CM | POA: Diagnosis not present

## 2016-12-06 DIAGNOSIS — M545 Low back pain: Secondary | ICD-10-CM | POA: Diagnosis not present

## 2016-12-06 DIAGNOSIS — G894 Chronic pain syndrome: Secondary | ICD-10-CM | POA: Diagnosis not present

## 2016-12-11 ENCOUNTER — Ambulatory Visit
Admission: EM | Admit: 2016-12-11 | Discharge: 2016-12-11 | Disposition: A | Discharge status: Home / Self Care | Payer: Medicare Other | Attending: Family Medicine | Admitting: Family Medicine

## 2016-12-11 DIAGNOSIS — M5432 Sciatica, left side: Secondary | ICD-10-CM | POA: Diagnosis not present

## 2016-12-11 DIAGNOSIS — M545 Low back pain: Secondary | ICD-10-CM

## 2016-12-11 MED ORDER — KETOROLAC TROMETHAMINE 60 MG/2ML IM SOLN
60.0000 mg | Freq: Once | INTRAMUSCULAR | Status: AC
  Administered 2016-12-11: 60 mg via INTRAMUSCULAR

## 2016-12-11 NOTE — ED Provider Notes (Addendum)
[facility]    CSN: [id number] Arrival date & time: 12/11/16  1632     History   Chief Complaint Chief Complaint  Patient presents with  . Back Pain    HPI Sally Barnes is a 46 y.o. female.   HPI  Is a 46 year old female who presents with low back pain and sciatica. He has several comorbidities including fibromyalgia, chronic pain, being seen by a chronic pain physician. She is complaining of retaining fluid in her left ankle her right index finger was sutured here several days ago and was concerned with the progress. Also complaining of facial swelling and doesn't know why she may have that. Is at her 74-year-old grandson who weighs 30 pounds lifting him into car seat  Repetitively. His may have been what caused her sciatica worsening. She states that she is on her chronic pain medications but that Toradol seems to help her immensely when she gets into this amount of pain. He does receive trigger point injections but that is every 3 months.       Past Medical History:  Diagnosis Date  . Anxiety   . Asthma   . Chronic pain   . Depression   . Diabetes (HCC)   . Fibromyalgia   . HTN (hypertension)   . Migraines   . Neuropathy   . Osteoarthritis   . Sciatica     Patient Active Problem List   Diagnosis Date Noted  . Fall 10/14/2016  . Foot sprain, left, initial encounter 10/14/2016  . Sprain of left ankle 10/14/2016    Past Surgical History:  Procedure Laterality Date  . APPENDECTOMY    . GASTRIC BYPASS    . OSTEOTOMY      OB History    No data available       Home Medications    Prior to Admission medications   Medication Sig Start Date End Date Taking? Authorizing Provider  albuterol (PROVENTIL HFA;VENTOLIN HFA) 108 (90 Base) MCG/ACT inhaler Inhale into the lungs every 6 (six) hours as needed for wheezing or shortness of breath.   Yes [provider]  amitriptyline (ELAVIL) 10 MG tablet Take 10 mg by mouth at bedtime.   Yes  [provider]  amLODipine (NORVASC) 10 MG tablet Take 10 mg by mouth daily.   Yes [provider]  amoxicillin (AMOXIL) 875 MG tablet Take 1 tablet (875 mg total) by mouth 2 (two) times daily. 12/05/16  Yes Payton Mccallum, MD  baclofen (LIORESAL) 10 MG tablet Take 10 mg by mouth 3 (three) times daily.   Yes [provider]  DULoxetine (CYMBALTA) 30 MG capsule Take 30 mg by mouth daily.   Yes [provider]  DULoxetine (CYMBALTA) 60 MG capsule Take 60 mg by mouth daily.   Yes [provider]  lisinopril (PRINIVIL,ZESTRIL) 2.5 MG tablet Take 2.5 mg by mouth daily.   Yes [provider]  metFORMIN (GLUCOPHAGE) 500 MG tablet Take by mouth 2 (two) times daily with a meal.   Yes [provider]  mometasone (NASONEX) 50 MCG/ACT nasal spray Place 2 sprays into the nose daily.   Yes [provider]  pregabalin (LYRICA) 200 MG capsule Take 200 mg by mouth 3 (three) times daily.   Yes [provider]  Tapentadol HCl (NUCYNTA) 100 MG TABS Take 100 mg by mouth 2 (two) times daily.   Yes [provider]  tiZANidine (ZANAFLEX) 4 MG capsule Take 4 mg by mouth 3 (three) times  daily.   Yes [provider]  fluconazole (DIFLUCAN) 150 MG tablet Take on Day one and repeat in one week if needed. 06/22/16   Hassan Rowan, MD  penicillin v potassium (VEETID) 500 MG tablet Take 1 tablet (500 mg total) by mouth 2 (two) times daily. 06/17/16   Eustace Moore, MD    Family History No family history on file.  Social History Social History  Substance Use Topics  . Smoking status: Current Every Day Smoker    Packs/day: 1.00    Types: Cigarettes  . Smokeless tobacco: Never Used  . Alcohol use Yes     Comment: social     Allergies   Phenobarbital; Sulfa antibiotics; Tylenol with codeine #3 [acetaminophen-codeine]; Naprosyn [naproxen]; and Percocet [oxycodone-acetaminophen]   Review of Systems Review of Systems    Constitutional: Positive for activity change. Negative for chills, fatigue and fever.  Musculoskeletal: Positive for back pain.  All other systems reviewed and are negative.    Physical Exam Triage Vital Signs ED Triage Vitals  Enc Vitals Group     BP 12/11/16 1801 (!) 127/105     Pulse Rate 12/11/16 1801 94     Resp 12/11/16 1801 18     Temp 12/11/16 1801 98.5 F (36.9 C)     Temp Source 12/11/16 1801 Oral     SpO2 12/11/16 1801 98 %     Weight --      Height --      Head Circumference --      Peak Flow --      Pain Score 12/11/16 1803 10     Pain Loc --      Pain Edu? --      Excl. in GC? --    No data found.   Updated Vital Signs BP (!) 127/105 (BP Location: Left Arm)   Pulse 94   Temp 98.5 F (36.9 C) (Oral)   Resp 18   LMP 11/08/2016 (Exact Date)   SpO2 98%   Visual Acuity Right Eye Distance:   Left Eye Distance:   Bilateral Distance:    Right Eye Near:   Left Eye Near:    Bilateral Near:     Physical Exam  Constitutional: She is oriented to person, place, and time. She appears well-developed and well-nourished. No distress.  HENT:  Head: Normocephalic.  Eyes: Pupils are equal, round, and reactive to light.  Neck: Normal range of motion.  Musculoskeletal: She exhibits tenderness.  Neurological: She is alert and oriented to person, place, and time.  Skin: Skin is warm and dry. She is not diaphoretic.  Psychiatric: She has a normal mood and affect. Her behavior is normal. Judgment and thought content normal.  Nursing note and vitals reviewed.    UC Treatments / Results  Labs (all labs ordered are listed, but only abnormal results are displayed) Labs Reviewed - No data to display  EKG  EKG Interpretation None       Radiology No results found.  Procedures Procedures (including critical care time)  Medications Ordered in UC Medications  ketorolac (TORADOL) injection 60 mg (60 mg Intramuscular Given 12/11/16 1853)     Initial  Impression / Assessment and Plan / UC Course  I have reviewed the triage vital signs and the nursing notes.  Pertinent labs & imaging results that were available during my care of the patient were reviewed by me and considered in my medical decision making (see chart for details).  Plan: 1. Test/x-ray results and diagnosis reviewed with patient 2. rx as per orders; risks, benefits, potential side effects reviewed with patient 3. Recommend supportive treatment with rest and symptom avoidance. Will continue on her medications prescribed through the pain management specialist. Medications were given from this office. He is not improving she should follow-up with her primary care. She was assured that the sutures look fine and that she should wait until Friday to have them removed. Follow-up with primary care physician regarding her swelling that she has been complaining of. To help ease the swelling in her lower semination should elevate above her heart several times daily 4. F/u prn if symptoms worsen or don't improve   Final Clinical Impressions(s) / UC Diagnoses   Final diagnoses:  Sciatica of left side    New Prescriptions Discharge Medication List as of 12/11/2016  7:20 PM       Controlled Substance Prescriptions [place] Controlled Substance Registry consulted? Not Applicable   Lutricia Feil, PA-C 12/11/16 1929    Lutricia Feil, PA-C 12/11/16 1930

## 2016-12-11 NOTE — ED Triage Notes (Signed)
Pt said she is having low back pain and sciatica that flares up from her fibromyalgia and also chronic pain. Also said she is retaining fluid in her left ankle. Also has stitches in her right index finger she is concerned isn't healing well. Stitches suppose to come out this Friday. Has been taking her normal medications on her med list.

## 2016-12-26 DIAGNOSIS — Z886 Allergy status to analgesic agent status: Secondary | ICD-10-CM | POA: Diagnosis not present

## 2016-12-26 DIAGNOSIS — Z882 Allergy status to sulfonamides status: Secondary | ICD-10-CM | POA: Diagnosis not present

## 2016-12-26 DIAGNOSIS — R197 Diarrhea, unspecified: Secondary | ICD-10-CM | POA: Diagnosis not present

## 2016-12-26 DIAGNOSIS — E119 Type 2 diabetes mellitus without complications: Secondary | ICD-10-CM | POA: Diagnosis not present

## 2016-12-26 DIAGNOSIS — Z880 Allergy status to penicillin: Secondary | ICD-10-CM | POA: Diagnosis not present

## 2016-12-26 DIAGNOSIS — Z9884 Bariatric surgery status: Secondary | ICD-10-CM | POA: Diagnosis not present

## 2016-12-26 DIAGNOSIS — F17210 Nicotine dependence, cigarettes, uncomplicated: Secondary | ICD-10-CM | POA: Diagnosis not present

## 2016-12-26 DIAGNOSIS — F329 Major depressive disorder, single episode, unspecified: Secondary | ICD-10-CM | POA: Diagnosis not present

## 2016-12-26 DIAGNOSIS — I10 Essential (primary) hypertension: Secondary | ICD-10-CM | POA: Diagnosis not present

## 2016-12-26 DIAGNOSIS — Z885 Allergy status to narcotic agent status: Secondary | ICD-10-CM | POA: Diagnosis not present

## 2016-12-26 DIAGNOSIS — Z79899 Other long term (current) drug therapy: Secondary | ICD-10-CM | POA: Diagnosis not present

## 2016-12-26 DIAGNOSIS — M797 Fibromyalgia: Secondary | ICD-10-CM | POA: Diagnosis not present

## 2016-12-31 DIAGNOSIS — Z23 Encounter for immunization: Secondary | ICD-10-CM | POA: Diagnosis not present

## 2017-02-21 DIAGNOSIS — M542 Cervicalgia: Secondary | ICD-10-CM | POA: Diagnosis not present

## 2017-02-21 DIAGNOSIS — M545 Low back pain: Secondary | ICD-10-CM | POA: Diagnosis not present

## 2017-02-21 DIAGNOSIS — G894 Chronic pain syndrome: Secondary | ICD-10-CM | POA: Diagnosis not present

## 2017-02-21 DIAGNOSIS — M25512 Pain in left shoulder: Secondary | ICD-10-CM | POA: Diagnosis not present

## 2017-02-22 DIAGNOSIS — G894 Chronic pain syndrome: Secondary | ICD-10-CM | POA: Diagnosis not present

## 2017-03-23 DIAGNOSIS — S63501A Unspecified sprain of right wrist, initial encounter: Secondary | ICD-10-CM | POA: Diagnosis not present

## 2017-03-23 DIAGNOSIS — M797 Fibromyalgia: Secondary | ICD-10-CM | POA: Diagnosis not present

## 2017-03-23 DIAGNOSIS — K6289 Other specified diseases of anus and rectum: Secondary | ICD-10-CM | POA: Diagnosis not present

## 2017-03-23 DIAGNOSIS — M25531 Pain in right wrist: Secondary | ICD-10-CM | POA: Diagnosis not present

## 2017-04-03 DIAGNOSIS — K1370 Unspecified lesions of oral mucosa: Secondary | ICD-10-CM | POA: Diagnosis not present

## 2017-04-03 DIAGNOSIS — G4700 Insomnia, unspecified: Secondary | ICD-10-CM | POA: Diagnosis not present

## 2017-04-03 DIAGNOSIS — F329 Major depressive disorder, single episode, unspecified: Secondary | ICD-10-CM | POA: Diagnosis not present

## 2017-04-03 DIAGNOSIS — M797 Fibromyalgia: Secondary | ICD-10-CM | POA: Diagnosis not present

## 2017-04-03 DIAGNOSIS — R0982 Postnasal drip: Secondary | ICD-10-CM | POA: Diagnosis not present

## 2017-05-18 ENCOUNTER — Encounter: Payer: Self-pay | Admitting: Gynecology

## 2017-05-18 ENCOUNTER — Ambulatory Visit
Admission: EM | Admit: 2017-05-18 | Discharge: 2017-05-18 | Disposition: A | Discharge status: Home / Self Care | Payer: Medicare Other | Attending: Family Medicine | Admitting: Family Medicine

## 2017-05-18 ENCOUNTER — Other Ambulatory Visit: Payer: Self-pay

## 2017-05-18 DIAGNOSIS — J40 Bronchitis, not specified as acute or chronic: Secondary | ICD-10-CM

## 2017-05-18 DIAGNOSIS — J0100 Acute maxillary sinusitis, unspecified: Secondary | ICD-10-CM

## 2017-05-18 DIAGNOSIS — R05 Cough: Secondary | ICD-10-CM | POA: Diagnosis not present

## 2017-05-18 DIAGNOSIS — R059 Cough, unspecified: Secondary | ICD-10-CM

## 2017-05-18 MED ORDER — AMOXICILLIN-POT CLAVULANATE 875-125 MG PO TABS: 1.0000 | ORAL_TABLET | Freq: Two times a day (BID) | ORAL | 0 refills | Status: AC

## 2017-05-18 MED ORDER — PREDNISONE 10 MG (21) PO TBPK: ORAL_TABLET | ORAL | 0 refills | Status: DC

## 2017-05-18 NOTE — Discharge Instructions (Addendum)
Recommend start Augmentin 875mg  twice a day as directed. Increase fluid intake. May start Prednisone 10mg - take 6 tablets today then decrease by 1 tablet each day until finished on day 6. Continue using Albuterol inhaler as directed. Encouraged to decrease smoking. Continue OTC cough medication as needed. Follow-up with your PCP in 3 to 4 days if not improving. May also need ENT referral since post nasal drainage persists despite various treatments.

## 2017-05-18 NOTE — ED Triage Notes (Signed)
Patient c/o x over 1 month cough and chest congestion. Patient stated has been seen by her primary and urgent care but not getting any better.

## 2017-05-18 NOTE — ED Provider Notes (Signed)
[facility]    CSN: [id number] Arrival date & time: 05/18/17  0834     History   Chief Complaint Chief Complaint  Patient presents with  . Cough    HPI Sally Barnes is a 47 y.o. female.   47 year old female presents with nasal congestion, cough, sinus pressure, ear fullness and slight dizziness for over 1 month. Also has had a low grade fever and fatigue the past week. Denies any GI symptoms. Has tried drinking tea and using Vicks Vapor Rub and Robitussin with no relief. Patient told RN she was seen by her PCP and Urgent Care for this but the patient is referring to previous sinus infections and bronchitis in which she was treated in Feb 2018 here and in Dec 2018 at her PCP for post nasal drainage. She is currently taking Nasonex and Atrovent with no relief. She continues to smoke and has multiple chronic health conditions including HTN, DM, Fibromyalgia, Neuropathy,  Depression and Anxiety and currently is on over 10 medications daily.    The history is provided by the patient.    Past Medical History:  Diagnosis Date  . Anxiety   . Asthma   . Chronic pain   . Depression   . Diabetes (HCC)   . Fibromyalgia   . HTN (hypertension)   . Migraines   . Neuropathy   . Osteoarthritis   . Sciatica     Patient Active Problem List   Diagnosis Date Noted  . Fall 10/14/2016  . Foot sprain, left, initial encounter 10/14/2016  . Sprain of left ankle 10/14/2016    Past Surgical History:  Procedure Laterality Date  . APPENDECTOMY    . GASTRIC BYPASS    . OSTEOTOMY      OB History    No data available       Home Medications    Prior to Admission medications   Medication Sig Start Date End Date Taking? Authorizing Provider  albuterol (PROVENTIL HFA;VENTOLIN HFA) 108 (90 Base) MCG/ACT inhaler Inhale into the lungs every 6 (six) hours as needed for wheezing or shortness of breath.   Yes [provider]  amitriptyline (ELAVIL) 10 MG tablet Take  10 mg by mouth at bedtime.   Yes [provider]  amLODipine (NORVASC) 10 MG tablet Take 10 mg by mouth daily.   Yes [provider]  baclofen (LIORESAL) 10 MG tablet Take 10 mg by mouth 3 (three) times daily.   Yes [provider]  DULoxetine (CYMBALTA) 30 MG capsule Take 30 mg by mouth daily.   Yes [provider]  DULoxetine (CYMBALTA) 60 MG capsule Take 60 mg by mouth daily.   Yes [provider]  lisinopril (PRINIVIL,ZESTRIL) 2.5 MG tablet Take 2.5 mg by mouth daily.   Yes [provider]  metFORMIN (GLUCOPHAGE) 500 MG tablet Take by mouth 2 (two) times daily with a meal.   Yes [provider]  mometasone (NASONEX) 50 MCG/ACT nasal spray Place 2 sprays into the nose daily.   Yes [provider]  pregabalin (LYRICA) 200 MG capsule Take 200 mg by mouth 3 (three) times daily.   Yes [provider]  Tapentadol HCl (NUCYNTA) 100 MG TABS Take 100 mg by mouth 2 (two) times daily.   Yes [provider]  tiZANidine (ZANAFLEX) 4 MG capsule Take 4 mg by mouth 3 (three) times daily.   Yes [provider]  amoxicillin-clavulanate (AUGMENTIN) 875-125 MG tablet Take 1 tablet by  mouth every 12 (twelve) hours for 7 days. 05/18/17 05/25/17  Sudie GrumblingAmyot, Ann Berry, NP  predniSONE (STERAPRED UNI-PAK 21 TAB) 10 MG (21) TBPK tablet Take 6 tablets by mouth today, then decrease by 1 tablet each day until finished on day 6. 05/18/17   Amyot, Ali LoweAnn Berry, NP    Family History Family History  Problem Relation Age of Onset  . Cancer Mother        breast  . Diabetes Mother   . Hyperlipidemia Mother   . Kidney failure Mother   . Cancer Father        throat  . Heart failure Father     Social History Social History   Tobacco Use  . Smoking status: Current Every Day Smoker    Packs/day: 1.00    Types: Cigarettes  . Smokeless tobacco: Never Used  Substance Use Topics  . Alcohol use: Yes    Comment: social  . Drug use: No      Allergies   Phenobarbital; Sulfa antibiotics; Tylenol with codeine #3 [acetaminophen-codeine]; Naprosyn [naproxen]; and Percocet [oxycodone-acetaminophen]   Review of Systems Review of Systems  Constitutional: Positive for fatigue and fever. Negative for appetite change and chills.  HENT: Positive for congestion, postnasal drip, rhinorrhea, sinus pressure, sinus pain and sore throat. Negative for ear discharge, ear pain, facial swelling, mouth sores, nosebleeds and trouble swallowing.   Eyes: Negative for pain, discharge, redness and itching.  Respiratory: Positive for cough. Negative for chest tightness, shortness of breath and wheezing.   Gastrointestinal: Negative for diarrhea, nausea and vomiting.  Musculoskeletal: Positive for arthralgias and myalgias. Negative for neck pain and neck stiffness.  Skin: Negative for rash and wound.  Allergic/Immunologic: Positive for environmental allergies.  Neurological: Positive for weakness, light-headedness and headaches. Negative for tremors, seizures and syncope.  Hematological: Negative for adenopathy. Does not bruise/bleed easily.  Psychiatric/Behavioral: The patient is nervous/anxious.      Physical Exam Triage Vital Signs ED Triage Vitals  Enc Vitals Group     BP 05/18/17 0847 108/69     Pulse Rate 05/18/17 0847 (!) 107     Resp 05/18/17 0847 16     Temp 05/18/17 0847 98.2 F (36.8 C)     Temp Source 05/18/17 0847 Oral     SpO2 05/18/17 0847 100 %     Weight 05/18/17 0849 225 lb (102.1 kg)     Height 05/18/17 0849 5\' 7"  (1.702 m)     Head Circumference --      Peak Flow --      Pain Score 05/18/17 0849 8     Pain Loc --      Pain Edu? --      Excl. in GC? --    No data found.  Updated Vital Signs BP 108/69 (BP Location: Left Arm)   Pulse (!) 107   Temp 98.2 F (36.8 C) (Oral)   Resp 16   Ht 5\' 7"  (1.702 m)   Wt 225 lb (102.1 kg)   LMP 05/11/2017   SpO2 100%   BMI 35.24 kg/m   Visual Acuity Right Eye  Distance:   Left Eye Distance:   Bilateral Distance:    Right Eye Near:   Left Eye Near:    Bilateral Near:     Physical Exam  Constitutional: She is oriented to person, place, and time. She appears well-developed and well-nourished. No distress.  HENT:  Head: Normocephalic and atraumatic.  Right Ear: Hearing, tympanic membrane, external ear and ear  canal normal.  Left Ear: Hearing, tympanic membrane, external ear and ear canal normal.  Nose: Mucosal edema and rhinorrhea present. Right sinus exhibits maxillary sinus tenderness. Right sinus exhibits no frontal sinus tenderness. Left sinus exhibits maxillary sinus tenderness. Left sinus exhibits no frontal sinus tenderness.  Mouth/Throat: Uvula is midline and mucous membranes are normal. Posterior oropharyngeal erythema present.  Eyes: Conjunctivae and EOM are normal.  Neck: Normal range of motion. Neck supple.  Cardiovascular: Regular rhythm. Tachycardia present.  Pulmonary/Chest: Effort normal. No respiratory distress. She has decreased breath sounds in the right upper field, the right lower field, the left upper field and the left lower field. She has wheezes in the right upper field and the left upper field. She has rhonchi in the right upper field, the right lower field, the left upper field and the left lower field. She has no rales.  Lymphadenopathy:    She has no cervical adenopathy.  Neurological: She is alert and oriented to person, place, and time.  Skin: Skin is warm and dry. Capillary refill takes less than 2 seconds. No rash noted.  Psychiatric: Her speech is normal and behavior is normal. Thought content normal. Her mood appears anxious.     UC Treatments / Results  Labs (all labs ordered are listed, but only abnormal results are displayed) Labs Reviewed - No data to display  EKG  EKG Interpretation None       Radiology No results found.  Procedures Procedures (including critical care time)  Medications  Ordered in UC Medications - No data to display   Initial Impression / Assessment and Plan / UC Course  I have reviewed the triage vital signs and the nursing notes.  Pertinent labs & imaging results that were available during my care of the patient were reviewed by me and considered in my medical decision making (see chart for details).    Reviewed with patient that she probably has a mild sinus infection- not just allergies particularly with new onset of fever this week. Recommend start Augmentin 875mg  twice a day as directed. Increase fluid intake. May start Prednisone 10mg - take 6 tablets today then decrease by 1 tablet each day until finished on day 6. Continue using Albuterol inhaler as directed. Encouraged to decrease smoking. Continue OTC cough medication as needed. Follow-up with her PCP in 3 to 4 days if not improving. May also need ENT referral since post nasal drainage persists despite various treatments.    Final Clinical Impressions(s) / UC Diagnoses   Final diagnoses:  Acute maxillary sinusitis, recurrence not specified  Cough  Bronchitis    ED Discharge Orders        Ordered    amoxicillin-clavulanate (AUGMENTIN) 875-125 MG tablet  Every 12 hours     05/18/17 0931    predniSONE (STERAPRED UNI-PAK 21 TAB) 10 MG (21) TBPK tablet     05/18/17 0931       Controlled Substance Prescriptions [place] Controlled Substance Registry consulted? Not Applicable   Sudie Grumbling, NP 05/18/17 2320

## 2017-05-21 ENCOUNTER — Telehealth: Payer: Self-pay | Admitting: *Deleted

## 2017-05-21 NOTE — Telephone Encounter (Signed)
Called patient, no answer, advised patient to follow up with PCP if symptoms persist. 

## 2017-05-27 DIAGNOSIS — M542 Cervicalgia: Secondary | ICD-10-CM | POA: Diagnosis not present

## 2017-05-27 DIAGNOSIS — G894 Chronic pain syndrome: Secondary | ICD-10-CM | POA: Diagnosis not present

## 2017-05-27 DIAGNOSIS — Z720 Tobacco use: Secondary | ICD-10-CM | POA: Diagnosis not present

## 2017-05-27 DIAGNOSIS — M545 Low back pain: Secondary | ICD-10-CM | POA: Diagnosis not present

## 2017-05-27 DIAGNOSIS — F17200 Nicotine dependence, unspecified, uncomplicated: Secondary | ICD-10-CM | POA: Diagnosis not present

## 2017-05-27 DIAGNOSIS — Z87891 Personal history of nicotine dependence: Secondary | ICD-10-CM | POA: Diagnosis not present

## 2017-05-27 DIAGNOSIS — M25512 Pain in left shoulder: Secondary | ICD-10-CM | POA: Diagnosis not present

## 2017-05-27 DIAGNOSIS — Z716 Tobacco abuse counseling: Secondary | ICD-10-CM | POA: Diagnosis not present

## 2017-06-24 ENCOUNTER — Other Ambulatory Visit: Payer: Self-pay

## 2017-06-24 ENCOUNTER — Ambulatory Visit
Admission: EM | Admit: 2017-06-24 | Discharge: 2017-06-24 | Disposition: A | Discharge status: Home / Self Care | Payer: Medicare Other | Attending: Family Medicine | Admitting: Family Medicine

## 2017-06-24 ENCOUNTER — Encounter: Payer: Self-pay | Admitting: Emergency Medicine

## 2017-06-24 DIAGNOSIS — G9331 Postviral fatigue syndrome: Secondary | ICD-10-CM

## 2017-06-24 DIAGNOSIS — G933 Postviral fatigue syndrome: Secondary | ICD-10-CM | POA: Diagnosis not present

## 2017-06-24 MED ORDER — BENZONATATE 100 MG PO CAPS: 100.0000 mg | ORAL_CAPSULE | Freq: Three times a day (TID) | ORAL | 0 refills | Status: DC | PRN

## 2017-06-24 NOTE — Discharge Instructions (Signed)
Rest, fluids. ° °Cough medication as prescribed. ° °Take care ° °Dr. Sarabelle Genson  °

## 2017-06-24 NOTE — ED Triage Notes (Signed)
Patient c/o cough, congestion and bodyaches that started a week ago.

## 2017-06-24 NOTE — ED Provider Notes (Signed)
[facility]    CSN: [id number] Arrival date & time: 06/24/17  1602  History   Chief Complaint Chief Complaint  Patient presents with  . Cough  . Generalized Body Aches   HPI  47 year old female presents with the above complaints.  Patient states that she has been sick since Friday.  He has had fever and chills, body aches, diarrhea, and a "wet cough".  Patient states that she has had recent influenza.  States that her pain has been worse as she is coming off pain medication as well.  She is currently afebrile.  No known exacerbating or relieving factors.  No other reported symptoms.  No other complaints or concerns at this time.  Past Medical History:  Diagnosis Date  . Anxiety   . Asthma   . Chronic pain   . Depression   . Diabetes (HCC)   . Fibromyalgia   . HTN (hypertension)   . Migraines   . Neuropathy   . Osteoarthritis   . Sciatica     Patient Active Problem List   Diagnosis Date Noted  . Fall 10/14/2016  . Foot sprain, left, initial encounter 10/14/2016  . Sprain of left ankle 10/14/2016    Past Surgical History:  Procedure Laterality Date  . APPENDECTOMY    . GASTRIC BYPASS    . OSTEOTOMY      OB History    No data available       Home Medications    Prior to Admission medications   Medication Sig Start Date End Date Taking? Authorizing Provider  amitriptyline (ELAVIL) 10 MG tablet Take 10 mg by mouth at bedtime.   Yes [provider]  amLODipine (NORVASC) 10 MG tablet Take 10 mg by mouth daily.   Yes [provider]  baclofen (LIORESAL) 10 MG tablet Take 10 mg by mouth 3 (three) times daily.   Yes [provider]  DULoxetine (CYMBALTA) 30 MG capsule Take 30 mg by mouth daily.   Yes [provider]  DULoxetine (CYMBALTA) 60 MG capsule Take 60 mg by mouth daily.   Yes [provider]  lisinopril (PRINIVIL,ZESTRIL) 2.5 MG tablet Take 2.5 mg by mouth daily.   Yes [provider]    metFORMIN (GLUCOPHAGE) 500 MG tablet Take by mouth 2 (two) times daily with a meal.   Yes [provider]  mometasone (NASONEX) 50 MCG/ACT nasal spray Place 2 sprays into the nose daily.   Yes [provider]  pregabalin (LYRICA) 200 MG capsule Take 200 mg by mouth 3 (three) times daily.   Yes [provider]  Tapentadol HCl (NUCYNTA) 100 MG TABS Take 100 mg by mouth 2 (two) times daily.   Yes [provider]  tiZANidine (ZANAFLEX) 4 MG capsule Take 4 mg by mouth 3 (three) times daily.   Yes [provider]  albuterol (PROVENTIL HFA;VENTOLIN HFA) 108 (90 Base) MCG/ACT inhaler Inhale into the lungs every 6 (six) hours as needed for wheezing or shortness of breath.    [provider]  benzonatate (TESSALON) 100 MG capsule Take 1 capsule (100 mg total) by mouth 3 (three) times daily as needed. 06/24/17   Tommie Sams, DO    Family History Family History  Problem Relation Age of Onset  . Cancer Mother        breast  . Diabetes Mother   . Hyperlipidemia Mother   . Kidney failure Mother   . Cancer Father  throat  . Heart failure Father     Social History Social History   Tobacco Use  . Smoking status: Current Every Day Smoker    Packs/day: 1.00    Types: Cigarettes  . Smokeless tobacco: Never Used  Substance Use Topics  . Alcohol use: Yes    Comment: social  . Drug use: No     Allergies   Phenobarbital; Sulfa antibiotics; Tylenol with codeine #3 [acetaminophen-codeine]; Naprosyn [naproxen]; and Percocet [oxycodone-acetaminophen]   Review of Systems Review of Systems  Constitutional: Positive for chills and fever.  Respiratory: Positive for cough.   Gastrointestinal: Positive for diarrhea.  Musculoskeletal:       Body aches.    Physical Exam Triage Vital Signs ED Triage Vitals  Enc Vitals Group     BP 06/24/17 1635 124/76     Pulse Rate 06/24/17 1635 (!) 108     Resp 06/24/17 1635 16     Temp 06/24/17 1635  98.9 F (37.2 C)     Temp Source 06/24/17 1635 Oral     SpO2 06/24/17 1635 98 %     Weight 06/24/17 1632 214 lb (97.1 kg)     Height 06/24/17 1632 5\' 7"  (1.702 m)     Head Circumference --      Peak Flow --      Pain Score 06/24/17 1631 10     Pain Loc --      Pain Edu? --      Excl. in GC? --    Updated Vital Signs BP 124/76 (BP Location: Left Arm)   Pulse (!) 108   Temp 98.9 F (37.2 C) (Oral)   Resp 16   Ht 5\' 7"  (1.702 m)   Wt 214 lb (97.1 kg)   LMP 05/20/2017   SpO2 98%   BMI 33.52 kg/m   Physical Exam  Constitutional: She is oriented to person, place, and time. She appears well-developed. No distress.  HENT:  Head: Normocephalic and atraumatic.  Mouth/Throat: Oropharynx is clear and moist.  Eyes: Conjunctivae are normal. Right eye exhibits no discharge. Left eye exhibits no discharge.  Cardiovascular:  Tachycardic. Regular rhythm.  Pulmonary/Chest: Effort normal and breath sounds normal. She has no wheezes. She has no rales.  Neurological: She is alert and oriented to person, place, and time.  Psychiatric: She has a normal mood and affect. Her behavior is normal.  Nursing note and vitals reviewed.  UC Treatments / Results  Labs (all labs ordered are listed, but only abnormal results are displayed) Labs Reviewed - No data to display  EKG  EKG Interpretation None       Radiology No results found.  Procedures Procedures (including critical care time)  Medications Ordered in UC Medications - No data to display   Initial Impression / Assessment and Plan / UC Course  I have reviewed the triage vital signs and the nursing notes.  Pertinent labs & imaging results that were available during my care of the patient were reviewed by me and considered in my medical decision making (see chart for details).     47 year old female presents with likely recent influenza.  Out of the treatment window.  Treating with Tessalon Perles.  Advised rest and fluids.   Supportive care.  Final Clinical Impressions(s) / UC Diagnoses   Final diagnoses:  Post-influenza syndrome    ED Discharge Orders        Ordered    benzonatate (TESSALON) 100 MG capsule  3 times daily PRN  06/24/17 1735     Controlled Substance Prescriptions [place] Controlled Substance Registry consulted? Not Applicable   Tommie SamsCook, Chisum Habenicht G, DO 06/24/17 1810

## 2017-10-27 ENCOUNTER — Ambulatory Visit
Admission: EM | Admit: 2017-10-27 | Discharge: 2017-10-27 | Disposition: A | Discharge status: Other Acute Inpatient Hospital | Payer: Medicare Other | Attending: Family Medicine | Admitting: Family Medicine

## 2017-10-27 ENCOUNTER — Ambulatory Visit (INDEPENDENT_AMBULATORY_CARE_PROVIDER_SITE_OTHER): Payer: Medicare Other

## 2017-10-27 ENCOUNTER — Other Ambulatory Visit: Payer: Self-pay

## 2017-10-27 DIAGNOSIS — R195 Other fecal abnormalities: Secondary | ICD-10-CM | POA: Diagnosis not present

## 2017-10-27 DIAGNOSIS — R935 Abnormal findings on diagnostic imaging of other abdominal regions, including retroperitoneum: Secondary | ICD-10-CM | POA: Diagnosis not present

## 2017-10-27 DIAGNOSIS — R1010 Upper abdominal pain, unspecified: Secondary | ICD-10-CM

## 2017-10-27 DIAGNOSIS — R05 Cough: Secondary | ICD-10-CM | POA: Diagnosis not present

## 2017-10-27 DIAGNOSIS — R42 Dizziness and giddiness: Secondary | ICD-10-CM | POA: Diagnosis not present

## 2017-10-27 DIAGNOSIS — R112 Nausea with vomiting, unspecified: Secondary | ICD-10-CM | POA: Diagnosis not present

## 2017-10-27 DIAGNOSIS — R109 Unspecified abdominal pain: Secondary | ICD-10-CM

## 2017-10-27 LAB — COMPREHENSIVE METABOLIC PANEL
ALK PHOS: 118 U/L (ref 38–126)
ALT: 23 U/L (ref 0–44)
ANION GAP: 16 — AB (ref 5–15)
AST: 32 U/L (ref 15–41)
Albumin: 3.2 g/dL — ABNORMAL LOW (ref 3.5–5.0)
BUN: 8 mg/dL (ref 6–20)
CALCIUM: 9.3 mg/dL (ref 8.9–10.3)
CO2: 24 mmol/L (ref 22–32)
Chloride: 101 mmol/L (ref 98–111)
Creatinine, Ser: 0.76 mg/dL (ref 0.44–1.00)
GFR calc non Af Amer: >60 mL/min (ref >60)
GLUCOSE: 100 mg/dL — AB (ref 70–99)
POTASSIUM: 3.5 mmol/L (ref 3.5–5.1)
SODIUM: 141 mmol/L (ref 135–145)
Total Bilirubin: 0.4 mg/dL (ref 0.3–1.2)
Total Protein: 6.8 g/dL (ref 6.5–8.1)

## 2017-10-27 LAB — CBC WITH DIFFERENTIAL/PLATELET
Basophils Absolute: 0.1 10*3/uL (ref 0–0.1)
Basophils Relative: 1 %
EOS ABS: 0 10*3/uL (ref 0–0.7)
EOS PCT: 1 %
HCT: 43.7 % (ref 35.0–47.0)
HEMOGLOBIN: 14.5 g/dL (ref 12.0–16.0)
Lymphocytes Relative: 38 %
Lymphs Abs: 3.6 10*3/uL (ref 1.0–3.6)
MCH: 31.2 pg (ref 26.0–34.0)
MCHC: 33.3 g/dL (ref 32.0–36.0)
MCV: 93.8 fL (ref 80.0–100.0)
MONO ABS: 0.7 10*3/uL (ref 0.2–0.9)
MONOS PCT: 7 %
NEUTROS PCT: 53 %
Neutro Abs: 5 10*3/uL (ref 1.4–6.5)
PLATELETS: 373 10*3/uL (ref 150–440)
RBC: 4.66 MIL/uL (ref 3.80–5.20)
RDW: 15.7 % — AB (ref 11.5–14.5)
WBC: 9.4 10*3/uL (ref 3.6–11.0)

## 2017-10-27 LAB — LIPASE, BLOOD: Lipase: 25 U/L (ref 11–51)

## 2017-10-27 MED ORDER — ONDANSETRON 8 MG PO TBDP
8.0000 mg | ORAL_TABLET | Freq: Once | ORAL | Status: AC
  Administered 2017-10-27: 8 mg via ORAL

## 2017-10-27 NOTE — ED Provider Notes (Signed)
[facility]    CSN: [id number] Arrival date & time: 10/27/17  1127     History   Chief Complaint Chief Complaint  Patient presents with  . Dizziness    HPI Sally Barnes is a 47 y.o. female.   47 yo female with a c/o dizziness, nausea, vomiting and abdominal pain for the past 4 days progressively worsening. Has had chills and sweats as well. Denies dysuria, hematuria. States also has not had a bowel movement for the past 2 days.   The history is provided by the patient.  Dizziness    Past Medical History:  Diagnosis Date  . Anxiety   . Asthma   . Chronic pain   . Depression   . Diabetes (HCC)   . Fibromyalgia   . HTN (hypertension)   . Migraines   . Neuropathy   . Osteoarthritis   . Sciatica     Patient Active Problem List   Diagnosis Date Noted  . Fall 10/14/2016  . Foot sprain, left, initial encounter 10/14/2016  . Sprain of left ankle 10/14/2016    Past Surgical History:  Procedure Laterality Date  . APPENDECTOMY    . GASTRIC BYPASS    . OSTEOTOMY      OB History   None      Home Medications    Prior to Admission medications   Medication Sig Start Date End Date Taking? Authorizing Provider  albuterol (PROVENTIL HFA;VENTOLIN HFA) 108 (90 Base) MCG/ACT inhaler Inhale into the lungs every 6 (six) hours as needed for wheezing or shortness of breath.   Yes [provider]  amitriptyline (ELAVIL) 10 MG tablet Take 10 mg by mouth at bedtime.   Yes [provider]  amLODipine (NORVASC) 10 MG tablet Take 10 mg by mouth daily.   Yes [provider]  baclofen (LIORESAL) 10 MG tablet Take 10 mg by mouth 3 (three) times daily.   Yes [provider]  BELBUCA 450 MCG FILM TAKE 1 FILM 2 TIMES A DAY FOR 30 DAYS 09/30/17  Yes [provider]  dicyclomine (BENTYL) 10 MG capsule TAKE 1 CAPSULE BY MOUTH FOUR TIMES A DAY 09/24/17  Yes [provider]  DULoxetine (CYMBALTA) 30 MG capsule Take 30 mg by  mouth daily.   Yes [provider]  DULoxetine (CYMBALTA) 60 MG capsule Take 60 mg by mouth daily.   Yes [provider]  liraglutide (VICTOZA) 18 MG/3ML SOPN Inject into the skin. 05/13/17 05/08/18 Yes [provider]  lisinopril (PRINIVIL,ZESTRIL) 2.5 MG tablet Take 2.5 mg by mouth daily.   Yes [provider]  LYRICA 50 MG capsule TAKE 1 CAPSULE 3 TIMES A DAY X 7 DAYS, THEN 1 CAP 2X DAILY X 7 DAYS, THEN 1 CAP DAILY X 7 DAYS 08/15/17  Yes [provider]  metFORMIN (GLUCOPHAGE) 500 MG tablet Take by mouth 2 (two) times daily with a meal.   Yes [provider]  mometasone (NASONEX) 50 MCG/ACT nasal spray Place 2 sprays into the nose daily.   Yes [provider]  montelukast (SINGULAIR) 10 MG tablet TAKE 1 TABLET BY MOUTH EVERY DAY AT NIGHT 10/19/17  Yes [provider]  pregabalin (LYRICA) 200 MG capsule Take 200 mg by mouth 3 (three) times daily.   Yes [provider]  tiZANidine (ZANAFLEX) 4 MG capsule Take 4 mg by mouth 3 (three) times daily.   Yes [provider]  traZODone (DESYREL) 100 MG tablet Take by mouth. 08/20/17  Yes [provider]  zolpidem (AMBIEN) 5 MG tablet TAKE 1 TABLET (5 MG TOTAL) BY MOUTH NIGHTLY AS NEEDED FOR SLEEP. 10/23/17  Yes [provider]  benzonatate (TESSALON) 100 MG capsule Take 1 capsule (100 mg total) by mouth 3 (three) times daily as needed. 06/24/17   Tommie Sams, DO  Tapentadol HCl (NUCYNTA) 100 MG TABS Take 100 mg by mouth 2 (two) times daily.    [provider]    Family History Family History  Problem Relation Age of Onset  . Cancer Mother        breast  . Diabetes Mother   . Hyperlipidemia Mother   . Kidney failure Mother   . Cancer Father        throat  . Heart failure Father     Social History Social History   Tobacco Use  . Smoking status: Current Every Day Smoker    Packs/day: 1.00    Types: Cigarettes  . Smokeless tobacco:  Never Used  Substance Use Topics  . Alcohol use: Yes    Comment: social  . Drug use: No     Allergies   Phenobarbital; Sulfa antibiotics; Tylenol with codeine #3 [acetaminophen-codeine]; Naprosyn [naproxen]; and Percocet [oxycodone-acetaminophen]   Review of Systems Review of Systems  Neurological: Positive for dizziness.     Physical Exam Triage Vital Signs ED Triage Vitals [10/27/17 1133]  Enc Vitals Group     BP 120/78     Pulse Rate (!) 117     Resp 18     Temp 98.1 F (36.7 C)     Temp Source Oral     SpO2 99 %     Weight 190 lb (86.2 kg)     Height 5\' 7"  (1.702 m)     Head Circumference      Peak Flow      Pain Score 8     Pain Loc      Pain Edu?      Excl. in GC?    No data found.  Updated Vital Signs BP 120/78 (BP Location: Left Arm)   Pulse (!) 117   Temp 98.1 F (36.7 C) (Oral)   Resp 18   Ht 5\' 7"  (1.702 m)   Wt 190 lb (86.2 kg)   SpO2 99%   BMI 29.76 kg/m   Visual Acuity Right Eye Distance:   Left Eye Distance:   Bilateral Distance:    Right Eye Near:   Left Eye Near:    Bilateral Near:     Physical Exam  Constitutional: She appears well-developed and well-nourished. No distress.  Cardiovascular: Normal rate, regular rhythm and normal heart sounds.  Pulmonary/Chest: Effort normal and breath sounds normal. No stridor. No respiratory distress. She has no wheezes. She has no rales.  Abdominal: Soft. Bowel sounds are normal. She exhibits no distension and no mass. There is tenderness (mid, upper abdomen). There is no rebound and no guarding.  Skin: She is not diaphoretic.  Nursing note and vitals reviewed.    UC Treatments / Results  Labs (all labs ordered are listed, but only abnormal results are displayed) Labs Reviewed  CBC WITH DIFFERENTIAL/PLATELET - Abnormal; Notable for the following components:      Result Value   RDW 15.7 (*)    All other components within normal limits  COMPREHENSIVE METABOLIC PANEL - Abnormal; Notable  for the following components:   Glucose, Bld 100 (*)    Albumin 3.2 (*)  Anion gap 16 (*)    All other components within normal limits  LIPASE, BLOOD    EKG None  Radiology Dg Chest 2 View  Result Date: 10/27/2017 CLINICAL DATA:  Productive cough and dizziness. EXAM: CHEST - 2 VIEW COMPARISON:  None. FINDINGS: Normal cardiac silhouette and mediastinal contours. No focal parenchymal opacities. No pleural effusion or pneumothorax no evidence of edema. No acute osseus abnormalities. Mildly accentuated thoracic kyphosis within the lower thoracic spine. IMPRESSION: No acute cardiopulmonary disease. Specifically, no evidence of pneumonia. Electronically Signed   By: Simonne Come M.D.   On: 10/27/2017 12:37   Dg Abd 2 Views  Result Date: 10/27/2017 CLINICAL DATA:  Upper/mid abdominal pain.  Constipation. EXAM: ABDOMEN - 2 VIEW COMPARISON:  Chest radiograph-earlier same day FINDINGS: Moderate colonic stool burden. Mild gaseous distention of a rather featureless loop of presumed small bowel within the left mid/lower abdomen measuring approximately 4.2 cm in diameter with potential bowel wall thickening. No pneumoperitoneum, pneumatosis or portal venous gas. Surgical staple lines are seen within the left upper and bilateral lower abdominal quadrants. Additional surgical clip overlies the right lower abdominal quadrant. An intrauterine device overlies the midline of lower pelvis. Limited visualization of the lower thorax is unremarkable. No acute osseus abnormalities. IMPRESSION: 1. Extensive postsurgical change of the abdomen with mild gaseous distension of a rather featureless loop of presumed small bowel within the left mid/lower abdomen - nonspecific though could be seen in the setting of early/partial small bowel obstruction. Clinical correlation is advised. 2. Moderate colonic stool burden. Electronically Signed   By: Simonne Come M.D.   On: 10/27/2017 12:36    Procedures Procedures (including  critical care time)  Medications Ordered in UC Medications  ondansetron (ZOFRAN-ODT) disintegrating tablet 8 mg (8 mg Oral Given 10/27/17 1201)    Initial Impression / Assessment and Plan / UC Course  I have reviewed the triage vital signs and the nursing notes.  Pertinent labs & imaging results that were available during my care of the patient were reviewed by me and considered in my medical decision making (see chart for details).     Final Clinical Impressions(s) / UC Diagnoses   Final diagnoses:  Pain of upper abdomen  Abnormal abdominal x-ray     Discharge Instructions     Discussed with patient abnormal x-ray findings of possible early/partial small bowel obstruction; recommend patient go to Emergency Department for further evaluation and management    ED Prescriptions    None      1. Labs/x-ray results and diagnosis reviewed with patient; patient given zofran 8mg  odt with improvement of nausea  2. Recommend patient go to Emergency Department for further evaluation and management   Controlled Substance Prescriptions [place] Controlled Substance Registry consulted? Not Applicable   Payton Mccallum, MD 10/27/17 [phone]

## 2017-10-27 NOTE — ED Triage Notes (Signed)
Patient complains of dizziness, nausea and vomiting, abdominal pain since Wednesday. Patient states that she has only had a BM twice since Wednesday which is unusual for her. Patient states that she is currently tapering down off her lyrica.

## 2017-10-27 NOTE — Discharge Instructions (Signed)
Discussed with patient abnormal x-ray findings of possible early/partial small bowel obstruction; recommend patient go to Emergency Department for further evaluation and management

## 2017-11-24 ENCOUNTER — Encounter: Payer: Self-pay | Admitting: Gynecology

## 2017-11-24 ENCOUNTER — Ambulatory Visit
Admission: EM | Admit: 2017-11-24 | Discharge: 2017-11-24 | Disposition: A | Discharge status: Home / Self Care | Payer: Medicare Other | Attending: Family Medicine | Admitting: Family Medicine

## 2017-11-24 DIAGNOSIS — R1013 Epigastric pain: Secondary | ICD-10-CM

## 2017-11-24 MED ORDER — HYDROCODONE-ACETAMINOPHEN 5-325 MG PO TABS: 1.0000 | ORAL_TABLET | Freq: Three times a day (TID) | ORAL | 0 refills | Status: DC | PRN

## 2017-11-24 MED ORDER — DEXLANSOPRAZOLE 60 MG PO CPDR: 60.0000 mg | DELAYED_RELEASE_CAPSULE | Freq: Every day | ORAL | 0 refills | Status: DC

## 2017-11-24 NOTE — ED Provider Notes (Signed)
[facility]   CSN: [id number] Arrival date & time: 11/24/17  1120  History   Chief Complaint Chief Complaint  Patient presents with  . Abdominal Pain   HPI  47 year old female with a complicated PMH presents with abdominal pain.  Patient has been admitted twice this month and was seen in the ER on 7/29 as well.  She has had ongoing upper abdominal pain.  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  She has had right upper quadrant ultrasound and CT which have been unrevealing.  She has been placed on proton pump inhibitor and Carafate.  She states that she continues to have severe pain.  Patient is a chronic pain patient.  She states that her home medications are not improving her pain.  Pain is severe.  She reports pain particularly after eating.  Additionally, patient reports ongoing lower extremity swelling.  Also reports difficulty with her memory.  She is had CT scan of the head which has been negative.  She is on amlodipine which could easily cause her lower extremity edema.  No other associated symptoms.  No other complaints.  Past Medical History:  Diagnosis Date  . Anxiety   . Asthma   . Chronic pain   . Depression   . Diabetes (HCC)   . Fibromyalgia   . HTN (hypertension)   . Migraines   . Neuropathy   . Osteoarthritis   . Sciatica    Patient Active Problem List   Diagnosis Date Noted  . Fall 10/14/2016  . Foot sprain, left, initial encounter 10/14/2016  . Sprain of left ankle 10/14/2016   Past Surgical History:  Procedure Laterality Date  . APPENDECTOMY    . GASTRIC BYPASS    . OSTEOTOMY     OB History   None    Home Medications    Prior to Admission medications   Medication Sig Start Date End Date Taking? Authorizing Provider  albuterol (PROVENTIL HFA;VENTOLIN HFA) 108 (90 Base) MCG/ACT inhaler Inhale into the lungs every 6 (six) hours as needed for  wheezing or shortness of breath.   Yes [provider]  amitriptyline (ELAVIL) 10 MG tablet Take 10 mg by mouth at bedtime.   Yes [provider]  amLODipine (NORVASC) 10 MG tablet Take 10 mg by mouth daily.   Yes [provider]  baclofen (LIORESAL) 10 MG tablet Take 10 mg by mouth 3 (three) times daily.   Yes [provider]  BELBUCA 450 MCG FILM TAKE 1 FILM 2 TIMES A DAY FOR 30 DAYS 09/30/17  Yes [provider]  dicyclomine (BENTYL) 10 MG capsule TAKE 1 CAPSULE BY MOUTH FOUR TIMES A DAY 09/24/17  Yes [provider]  DULoxetine (CYMBALTA) 30 MG capsule Take 30 mg by mouth daily.   Yes [provider]  DULoxetine (CYMBALTA) 60 MG capsule Take 60 mg by mouth daily.   Yes [provider]  liraglutide (VICTOZA) 18 MG/3ML SOPN Inject into the skin. 05/13/17 05/08/18 Yes [provider]  lisinopril (PRINIVIL,ZESTRIL) 2.5 MG tablet Take 2.5 mg by mouth daily.   Yes [provider]  LYRICA 50 MG capsule TAKE 1 CAPSULE 3 TIMES A DAY X 7 DAYS, THEN 1 CAP 2X DAILY X 7 DAYS, THEN 1 CAP DAILY X 7 DAYS 08/15/17  Yes [provider]  metFORMIN (GLUCOPHAGE) 500 MG tablet Take by mouth 2 (two) times daily with  a meal.   Yes [provider]  mometasone (NASONEX) 50 MCG/ACT nasal spray Place 2 sprays into the nose daily.   Yes [provider]  montelukast (SINGULAIR) 10 MG tablet TAKE 1 TABLET BY MOUTH EVERY DAY AT NIGHT 10/19/17  Yes [provider]  pregabalin (LYRICA) 200 MG capsule Take 200 mg by mouth 3 (three) times daily.   Yes [provider]  tiZANidine (ZANAFLEX) 4 MG capsule Take 4 mg by mouth 3 (three) times daily.   Yes [provider]  traZODone (DESYREL) 100 MG tablet Take by mouth. 08/20/17  Yes [provider]  zolpidem (AMBIEN) 5 MG tablet TAKE 1 TABLET (5 MG TOTAL) BY MOUTH NIGHTLY AS NEEDED FOR SLEEP. 10/23/17  Yes [provider]    dexlansoprazole (DEXILANT) 60 MG capsule Take 1 capsule (60 mg total) by mouth daily. 11/24/17   Tommie Samsook, Mackensi Mahadeo G, DO  HYDROcodone-acetaminophen (NORCO/VICODIN) 5-325 MG tablet Take 1 tablet by mouth every 8 (eight) hours as needed for moderate pain. 11/24/17   Tommie Samsook, Hisham Provence G, DO   Family History Family History  Problem Relation Age of Onset  . Cancer Mother        breast  . Diabetes Mother   . Hyperlipidemia Mother   . Kidney failure Mother   . Cancer Father        throat  . Heart failure Father    Social History Social History   Tobacco Use  . Smoking status: Current Every Day Smoker    Packs/day: 1.00    Types: Cigarettes  . Smokeless tobacco: Never Used  Substance Use Topics  . Alcohol use: Yes    Comment: social  . Drug use: No   Allergies   Phenobarbital; Sulfa antibiotics; Tylenol with codeine #3 [acetaminophen-codeine]; Naprosyn [naproxen]; and Percocet [oxycodone-acetaminophen]  Review of Systems Review of Systems  Constitutional: Negative.   Gastrointestinal: Positive for abdominal pain.  Psychiatric/Behavioral:       Memory difficulty.    Physical Exam Triage Vital Signs ED Triage Vitals  Enc Vitals Group     BP 11/24/17 1136 123/78     Pulse Rate 11/24/17 1136 (!) 111     Resp 11/24/17 1136 16     Temp 11/24/17 1136 98.8 F (37.1 C)     Temp Source 11/24/17 1136 Oral     SpO2 11/24/17 1136 100 %     Weight 11/24/17 1136 190 lb (86.2 kg)     Height --      Head Circumference --      Peak Flow --      Pain Score 11/24/17 1138 10     Pain Loc --      Pain Edu? --      Excl. in GC? --    Updated Vital Signs BP 123/78 (BP Location: Left Arm)   Pulse (!) 111   Temp 98.8 F (37.1 C) (Oral)   Resp 16   Wt 190 lb (86.2 kg)   SpO2 100%   BMI 29.76 kg/m   Visual Acuity Right Eye Distance:   Left Eye Distance:   Bilateral Distance:    Right Eye Near:   Left Eye Near:    Bilateral Near:     Physical Exam  Constitutional: She is oriented to  person, place, and time. She appears well-developed. No distress.  HENT:  Head: Normocephalic and atraumatic.  Cardiovascular: Regular rhythm.  Tachycardic.   Pulmonary/Chest: Effort normal. She has no wheezes. She has no rales.  Neurological: She is alert and oriented to person, place, and time.  Psychiatric:  Tangential. Perseverative.   Nursing note and vitals reviewed.  UC Treatments / Results  Labs (all labs ordered are listed, but only abnormal results are displayed) Labs Reviewed - No data to display  EKG None  Radiology No results found.  Procedures Procedures (including critical care time)  Medications Ordered in UC Medications - No data to display  Initial Impression / Assessment and Plan / UC Course  I have reviewed the triage vital signs and the nursing notes.  Pertinent labs & imaging results that were available during my care of the patient were reviewed by me and considered in my medical decision making (see chart for details).     47 year old female presents with epigastric pain. Stopping Nexium. Starting Dexilant. Given small amount of pain medication to be used for breakthrough (patient is on chronic narcotics).  Final Clinical Impressions(s) / UC Diagnoses   Final diagnoses:  Abdominal pain, epigastric     Discharge Instructions     Stop the nexium.  Start Dexilant.  I have given you some Hydrocodone to be used for severe/breakthrough pain.   Please inform your pain management physician.  Take care  Dr. Adriana Simas    ED Prescriptions    Medication Sig Dispense Auth. Provider   dexlansoprazole (DEXILANT) 60 MG capsule Take 1 capsule (60 mg total) by mouth daily. 90 capsule Breyon Sigg G, DO   HYDROcodone-acetaminophen (NORCO/VICODIN) 5-325 MG tablet Take 1 tablet by mouth every 8 (eight) hours as needed for moderate pain. 10 tablet Tommie Sams, DO     Controlled Substance Prescriptions [place] Controlled Substance Registry consulted? Yes, I  have consulted the [place] Controlled Substances Registry for this patient. Patient is on chronic pain medication. She is having acute on chronic pain due to current illness. Will give small Rx of Vicodin to be used for breakthrough pain.    Tommie Sams, [place] 11/24/17 1327

## 2017-11-24 NOTE — ED Triage Notes (Signed)
Per patient c/o abdominal pain/ swollen feet/ memory problem/ pain. Per patient was seen at her primary on 7/19 and [facility] and not getting any better.

## 2017-11-24 NOTE — Discharge Instructions (Signed)
Stop the nexium.  Start Dexilant.  I have given you some Hydrocodone to be used for severe/breakthrough pain.   Please inform your pain management physician.  Take care  Dr. Adriana Simasook

## 2018-05-17 ENCOUNTER — Other Ambulatory Visit: Payer: Self-pay

## 2018-05-17 ENCOUNTER — Ambulatory Visit
Admission: EM | Admit: 2018-05-17 | Discharge: 2018-05-17 | Disposition: A | Discharge status: Home / Self Care | Payer: Medicare Other | Attending: Emergency Medicine | Admitting: Emergency Medicine

## 2018-05-17 DIAGNOSIS — R05 Cough: Secondary | ICD-10-CM | POA: Diagnosis not present

## 2018-05-17 DIAGNOSIS — Z720 Tobacco use: Secondary | ICD-10-CM

## 2018-05-17 DIAGNOSIS — J069 Acute upper respiratory infection, unspecified: Secondary | ICD-10-CM | POA: Diagnosis not present

## 2018-05-17 DIAGNOSIS — R059 Cough, unspecified: Secondary | ICD-10-CM

## 2018-05-17 MED ORDER — BENZONATATE 200 MG PO CAPS: 200.0000 mg | ORAL_CAPSULE | Freq: Three times a day (TID) | ORAL | 0 refills | Status: DC | PRN

## 2018-05-17 MED ORDER — FLUTICASONE PROPIONATE 50 MCG/ACT NA SUSP: 2.0000 | Freq: Every day | NASAL | 0 refills | Status: AC

## 2018-05-17 MED ORDER — DOXYCYCLINE HYCLATE 100 MG PO CAPS: 100.0000 mg | ORAL_CAPSULE | Freq: Two times a day (BID) | ORAL | 0 refills | Status: AC

## 2018-05-17 NOTE — Discharge Instructions (Addendum)
Start plain Mucinex, discontinue all allergy medicine, start Flonase, saline nasal irrigation with a Lloyd Huger med rinse and distilled water as often as you want, Tessalon for the cough.   2 puffs from your albuterol inhaler using a spacer every 4-6 hours as needed for cough. Wait-and-see prescription of doxycycline.  do not fill this unless she has been sick for more than 10 days, or if you start having fevers above 100.4.

## 2018-05-17 NOTE — ED Triage Notes (Addendum)
Pt reports people are sick in her home with URIs. Took them to the doctor and they are taking antibiotics. Pt has had a cough for a week. Coughing up some phlegm. No fevers

## 2018-05-17 NOTE — ED Provider Notes (Signed)
HPI  SUBJECTIVE:  Angelik Woodlief is a 47 y.o. female who presents with 1 week of cough productive of thick whitish sputum, postnasal drip.  States that she is coughing all night long.  She states that her children have been ill for the past 2 weeks with URIs and are now on Z-Paks.  She denies nasal congestion, rhinorrhea, sinus pain or pressure, fevers, wheezing, chest pain, shortness of breath, dyspnea on exertion.  No allergy or GERD symptoms.  No antibiotics in the past month.  No antipyretic in the past 4 to 6 hours.  She has been taking an allergy and cold medication, 2 puffs from her albuterol inhaler every 4-6 hours.  She does not have a spacer for this.  No aggravating or alleviating factors.  She has a past medical history of asthma, continues to smoke.  She has diet-controlled diabetes, states that her glucose has been running within normal limits for her, hypertension for which she takes lisinopril.  Also fibromyalgia, gastric bypass.  LMP: 6 to 7 months ago.  Has an IUD.  Denies the possibility of being pregnant.  PMD: Dr. Randa Lynn at family practice in [place].    Past Medical History:  Diagnosis Date  . Anxiety   . Asthma   . Chronic pain   . Depression   . Diabetes (HCC)   . Fibromyalgia   . HTN (hypertension)   . Migraines   . Neuropathy   . Osteoarthritis   . Sciatica     Past Surgical History:  Procedure Laterality Date  . APPENDECTOMY    . GASTRIC BYPASS    . OSTEOTOMY      Family History  Problem Relation Age of Onset  . Cancer Mother        breast  . Diabetes Mother   . Hyperlipidemia Mother   . Kidney failure Mother   . Cancer Father        throat  . Heart failure Father     Social History   Tobacco Use  . Smoking status: Current Every Day Smoker    Packs/day: 1.00    Types: Cigarettes  . Smokeless tobacco: Never Used  Substance Use Topics  . Alcohol use: Yes    Comment: social  . Drug use: No    No current facility-administered  medications for this encounter.   Current Outpatient Medications:  .  levonorgestrel (MIRENA) 20 MCG/24HR IUD, 1 each by Intrauterine route once., Disp: , Rfl:  .  albuterol (PROVENTIL HFA;VENTOLIN HFA) 108 (90 Base) MCG/ACT inhaler, Inhale into the lungs every 6 (six) hours as needed for wheezing or shortness of breath., Disp: , Rfl:  .  amitriptyline (ELAVIL) 10 MG tablet, Take 10 mg by mouth at bedtime., Disp: , Rfl:  .  baclofen (LIORESAL) 10 MG tablet, Take 10 mg by mouth 3 (three) times daily., Disp: , Rfl:  .  benzonatate (TESSALON) 200 MG capsule, Take 1 capsule (200 mg total) by mouth 3 (three) times daily as needed for cough., Disp: 30 capsule, Rfl: 0 .  doxycycline (VIBRAMYCIN) 100 MG capsule, Take 1 capsule (100 mg total) by mouth 2 (two) times daily for 7 days., Disp: 14 capsule, Rfl: 0 .  DULoxetine (CYMBALTA) 30 MG capsule, Take 30 mg by mouth daily., Disp: , Rfl:  .  DULoxetine (CYMBALTA) 60 MG capsule, Take 60 mg by mouth daily., Disp: , Rfl:  .  fluticasone (FLONASE) 50 MCG/ACT nasal spray, Place 2 sprays into both nostrils daily., Disp:  16 g, Rfl: 0 .  lisinopril (PRINIVIL,ZESTRIL) 2.5 MG tablet, Take 5 mg by mouth daily. , Disp: , Rfl:  .  LYRICA 50 MG capsule, TAKE 1 CAPSULE 3 TIMES A DAY X 7 DAYS, THEN 1 CAP 2X DAILY X 7 DAYS, THEN 1 CAP DAILY X 7 DAYS, Disp: , Rfl: 0 .  metFORMIN (GLUCOPHAGE) 500 MG tablet, Take by mouth 2 (two) times daily with a meal., Disp: , Rfl:  .  mometasone (NASONEX) 50 MCG/ACT nasal spray, Place 2 sprays into the nose daily., Disp: , Rfl:  .  montelukast (SINGULAIR) 10 MG tablet, TAKE 1 TABLET BY MOUTH EVERY DAY AT NIGHT, Disp: , Rfl: 3 .  pregabalin (LYRICA) 200 MG capsule, Take 200 mg by mouth 3 (three) times daily., Disp: , Rfl:  .  tiZANidine (ZANAFLEX) 4 MG capsule, Take 4 mg by mouth 3 (three) times daily., Disp: , Rfl:  .  traZODone (DESYREL) 100 MG tablet, Take by mouth., Disp: , Rfl:   Allergies  Allergen Reactions  . Phenobarbital  Other (See Comments)    hallucinations  . Sulfa Antibiotics     palpitations  . Tylenol With Codeine #3 [Acetaminophen-Codeine] Other (See Comments)    shakes  . Naprosyn [Naproxen] Palpitations  . Percocet [Oxycodone-Acetaminophen] Palpitations     ROS  As noted in HPI.   Physical Exam  BP 132/83 (BP Location: Left Arm)   Pulse 89   Temp 98.3 F (36.8 C) (Oral)   Resp 16   Ht 5\' 7"  (1.702 m)   Wt 85.3 kg   SpO2 100%   BMI 29.44 kg/m   Constitutional: Well developed, well nourished, no acute distress Eyes:  EOMI, conjunctiva normal bilaterally HENT: Normocephalic, atraumatic,mucus membranes moist.  Mucoid nasal congestion.  Erythematous, swollen turbinates on the right more so than the left.  No maxillary, frontal sinus tenderness.  Normal oropharynx with cobblestoning.  No obvious postnasal drip. Respiratory: Normal inspiratory effort lungs clear bilaterally, good air movement.  No chest wall tenderness  cardiovascular: Normal rate and rhythm, no murmurs, rubs, gallops GI: nondistended skin: No rash, skin intact Musculoskeletal: no deformities Neurologic: Alert & oriented x 3, no focal neuro deficits Psychiatric: Speech and behavior appropriate   ED Course   Medications - No data to display  No orders of the defined types were placed in this encounter.   No results found for this or any previous visit (from the past 24 hour(s)). No results found.  ED Clinical Impression  Upper respiratory tract infection, unspecified type  Cough   ED Assessment/Plan  Patient with a URI with a cough.  Suspect that cough is from the postnasal drip.  We will have her start Mucinex, discontinue allergy medicine, start Flonase, saline nasal irrigation with a Lloyd Huger med rinse and distilled water as often as she wants, Tessalon for the cough.  Wait-and-see prescription of doxycycline.  Advised her to not fill this unless she has been sick for more than 10 days, or if she starts  having fevers above 100.4.  She is to do 2 puffs from her albuterol inhaler using a spacer every 4-6 hours as needed for cough. Will rx a spacer.  She states that she does not need a refill on her albuterol inhaler. we talked about doing steroids, but patient declined.  No narcotic cough syrup as patient had an SBO from narcotic use last year.  Discussed  MDM, treatment plan, and plan for follow-up with patient. .patient agrees with plan.  Meds ordered this encounter  Medications  . fluticasone (FLONASE) 50 MCG/ACT nasal spray    Sig: Place 2 sprays into both nostrils daily.    Dispense:  16 g    Refill:  0  . benzonatate (TESSALON) 200 MG capsule    Sig: Take 1 capsule (200 mg total) by mouth 3 (three) times daily as needed for cough.    Dispense:  30 capsule    Refill:  0  . doxycycline (VIBRAMYCIN) 100 MG capsule    Sig: Take 1 capsule (100 mg total) by mouth 2 (two) times daily for 7 days.    Dispense:  14 capsule    Refill:  0    *This clinic note was created using Scientist, clinical (histocompatibility and immunogenetics)Dragon dictation software. Therefore, there may be occasional mistakes despite careful proofreading.   ?   Domenick GongMortenson, Pailyn [place], MD 05/18/18 1121

## 2018-06-24 ENCOUNTER — Other Ambulatory Visit: Payer: Self-pay

## 2018-06-24 ENCOUNTER — Ambulatory Visit
Admission: EM | Admit: 2018-06-24 | Discharge: 2018-06-24 | Disposition: A | Discharge status: Home / Self Care | Payer: Medicare Other | Attending: Family Medicine | Admitting: Family Medicine

## 2018-06-24 ENCOUNTER — Encounter: Payer: Self-pay | Admitting: Emergency Medicine

## 2018-06-24 DIAGNOSIS — J00 Acute nasopharyngitis [common cold]: Secondary | ICD-10-CM

## 2018-06-24 DIAGNOSIS — N390 Urinary tract infection, site not specified: Secondary | ICD-10-CM

## 2018-06-24 LAB — URINALYSIS, COMPLETE (UACMP) WITH MICROSCOPIC
Bilirubin Urine: NEGATIVE
Glucose, UA: NEGATIVE mg/dL
KETONES UR: NEGATIVE mg/dL
Nitrite: NEGATIVE
PROTEIN: NEGATIVE mg/dL
Specific Gravity, Urine: 1.015 (ref 1.005–1.030)
pH: 5.5 (ref 5.0–8.0)

## 2018-06-24 MED ORDER — CEPHALEXIN 500 MG PO CAPS
500.0000 mg | ORAL_CAPSULE | Freq: Two times a day (BID) | ORAL | 0 refills | Status: DC
Start: 2018-06-24 — End: 2019-07-26

## 2018-06-24 MED ORDER — FLUCONAZOLE 150 MG PO TABS: 150.0000 mg | ORAL_TABLET | Freq: Every day | ORAL | 0 refills | Status: AC

## 2018-06-24 NOTE — ED Triage Notes (Signed)
Patient c/o cough and runny nose that started about a month ago.  Patient reports pain and discomfort when urinating that started on Friday.  Patient reports chills.

## 2018-06-24 NOTE — ED Provider Notes (Signed)
[facility]    CSN: [id number] Arrival date & time: 06/24/18  7829     History   Chief Complaint Chief Complaint  Patient presents with  . Cough  . Dysuria    HPI Sally Barnes is a 48 y.o. female.   The history is provided by the patient.  Cough  Associated symptoms: rhinorrhea   Associated symptoms: no fever and no wheezing   Dysuria  Pain quality:  Burning Pain severity:  Moderate Onset quality:  Sudden Duration:  4 days Timing:  Constant Progression:  Unchanged Chronicity:  New Recent urinary tract infections: no   Relieved by:  None tried Ineffective treatments:  None tried Associated symptoms: no abdominal pain, no fever, no flank pain, no genital lesions, no nausea, no vaginal discharge and no vomiting   URI  Presenting symptoms: cough and rhinorrhea   Presenting symptoms: no fever   Severity:  Moderate Onset quality:  Sudden Duration:  1 month Timing:  Intermittent Progression:  Unchanged Chronicity:  New Relieved by:  OTC medications Associated symptoms: no wheezing   Risk factors: sick contacts     Past Medical History:  Diagnosis Date  . Anxiety   . Asthma   . Chronic pain   . Depression   . Diabetes (HCC)   . Fibromyalgia   . HTN (hypertension)   . Migraines   . Neuropathy   . Osteoarthritis   . Sciatica     Patient Active Problem List   Diagnosis Date Noted  . Fall 10/14/2016  . Foot sprain, left, initial encounter 10/14/2016  . Sprain of left ankle 10/14/2016    Past Surgical History:  Procedure Laterality Date  . APPENDECTOMY    . GASTRIC BYPASS    . OSTEOTOMY      OB History   No obstetric history on file.      Home Medications    Prior to Admission medications   Medication Sig Start Date End Date Taking? Authorizing Provider  amitriptyline (ELAVIL) 10 MG tablet Take 10 mg by mouth at bedtime.   Yes [provider]  baclofen (LIORESAL) 10 MG tablet Take 10 mg by mouth 3 (three) times  daily.   Yes [provider]  DULoxetine (CYMBALTA) 30 MG capsule Take 30 mg by mouth daily.   Yes [provider]  DULoxetine (CYMBALTA) 60 MG capsule Take 60 mg by mouth daily.   Yes [provider]  fluticasone (FLONASE) 50 MCG/ACT nasal spray Place 2 sprays into both nostrils daily. 05/17/18  Yes Domenick Gong, MD  levonorgestrel (MIRENA) 20 MCG/24HR IUD 1 each by Intrauterine route once.   Yes [provider]  lisinopril (PRINIVIL,ZESTRIL) 2.5 MG tablet Take 5 mg by mouth daily.    Yes [provider]  mometasone (NASONEX) 50 MCG/ACT nasal spray Place 2 sprays into the nose daily.   Yes [provider]  montelukast (SINGULAIR) 10 MG tablet TAKE 1 TABLET BY MOUTH EVERY DAY AT NIGHT 10/19/17  Yes [provider]  potassium chloride (K-DUR,KLOR-CON) 10 MEQ tablet Take by mouth. 02/03/18 02/03/19 Yes [provider]  tiZANidine (ZANAFLEX) 4 MG capsule Take 4 mg by mouth 3 (three) times daily.   Yes [provider]  traZODone (DESYREL) 100 MG tablet Take by mouth. 08/20/17  Yes [provider]  albuterol (PROVENTIL HFA;VENTOLIN HFA) 108 (90 Base) MCG/ACT inhaler Inhale into the lungs every 6 (six) hours as needed for wheezing or shortness of breath.    [provider]  benzonatate (TESSALON) 200 MG capsule Take 1 capsule (200 mg total) by mouth 3 (three) times daily as needed for cough. 05/17/18   Domenick Gong, MD  cephALEXin (KEFLEX) 500 MG capsule Take 1 capsule (500 mg total) by mouth 2 (two) times daily. 06/24/18   Payton Mccallum, MD  [facility] MELATONIN 3 MG TABS Take 1 tablet by mouth at bedtime. 05/21/18   [provider]  [facility] SENNA 8.6 MG tablet TAKE 2 TABLETS BY MOUTH EVERY DAY AT NIGHT 02/03/18   [provider]  fluconazole (DIFLUCAN) 150 MG tablet Take 1 tablet (150 mg total) by mouth daily. 06/24/18   Kinnedy Mongiello, Pamala Hurry, MD  LYRICA 50 MG capsule TAKE 1 CAPSULE 3 TIMES A DAY X 7  DAYS, THEN 1 CAP 2X DAILY X 7 DAYS, THEN 1 CAP DAILY X 7 DAYS 08/15/17   [provider]  magnesium oxide (MAG-OX) 400 MG tablet Take 1 tablet by mouth daily. 02/03/18   [provider]  metFORMIN (GLUCOPHAGE) 500 MG tablet Take by mouth 2 (two) times daily with a meal.    [provider]  pregabalin (LYRICA) 200 MG capsule Take 200 mg by mouth 3 (three) times daily.    [provider]    Family History Family History  Problem Relation Age of Onset  . Cancer Mother        breast  . Diabetes Mother   . Hyperlipidemia Mother   . Kidney failure Mother   . Cancer Father        throat  . Heart failure Father     Social History Social History   Tobacco Use  . Smoking status: Current Every Day Smoker    Packs/day: 1.00    Types: Cigarettes  . Smokeless tobacco: Never Used  Substance Use Topics  . Alcohol use: Yes    Comment: social  . Drug use: No     Allergies   Phenobarbital; Sulfa antibiotics; Tylenol with codeine #3 [acetaminophen-codeine]; Naprosyn [naproxen]; and Percocet [oxycodone-acetaminophen]   Review of Systems Review of Systems  Constitutional: Negative for fever.  HENT: Positive for rhinorrhea.   Respiratory: Positive for cough. Negative for wheezing.   Gastrointestinal: Negative for abdominal pain, nausea and vomiting.  Genitourinary: Positive for dysuria. Negative for flank pain and vaginal discharge.     Physical Exam Triage Vital Signs ED Triage Vitals  Enc Vitals Group     BP 06/24/18 0956 (!) 145/98     Pulse Rate 06/24/18 0956 (!) 104     Resp 06/24/18 0956 16     Temp 06/24/18 0956 98.8 F (37.1 C)     Temp Source 06/24/18 0956 Oral     SpO2 06/24/18 0956 100 %     Weight 06/24/18 0952 199 lb (90.3 kg)     Height 06/24/18 0952 5\' 8"  (1.727 m)     Head Circumference --      Peak Flow --      Pain Score 06/24/18 0952 7     Pain Loc --      Pain Edu? --      Excl. in GC? --    No data found.  Updated  Vital Signs BP (!) 145/98 (BP Location: Left Arm) Comment: Patient states that she has not taken her BP medicine this morning  Pulse (!) 104   Temp 98.8 F (37.1 C) (Oral)   Resp 16   Ht 5\' 8"  (1.727 m)   Wt 90.3 kg   SpO2  100%   BMI 30.26 kg/m   Visual Acuity Right Eye Distance:   Left Eye Distance:   Bilateral Distance:    Right Eye Near:   Left Eye Near:    Bilateral Near:     Physical Exam Vitals signs and nursing note reviewed.  Constitutional:      General: She is not in acute distress.    Appearance: She is well-developed. She is not toxic-appearing or diaphoretic.  Cardiovascular:     Rate and Rhythm: Normal rate and regular rhythm.     Heart sounds: Normal heart sounds.  Pulmonary:     Effort: Pulmonary effort is normal. No respiratory distress.     Breath sounds: Normal breath sounds. No stridor. No wheezing or rhonchi.  Abdominal:     General: Bowel sounds are normal. There is no distension.     Palpations: Abdomen is soft. There is no mass.     Tenderness: There is no abdominal tenderness. There is no right CVA tenderness, left CVA tenderness, guarding or rebound.  Neurological:     Mental Status: She is alert.      UC Treatments / Results  Labs (all labs ordered are listed, but only abnormal results are displayed) Labs Reviewed  URINALYSIS, COMPLETE (UACMP) WITH MICROSCOPIC - Abnormal; Notable for the following components:      Result Value   APPearance CLOUDY (*)    Hgb urine dipstick TRACE (*)    Leukocytes,Ua LARGE (*)    Bacteria, UA MANY (*)    All other components within normal limits  URINE CULTURE    EKG None  Radiology No results found.  Procedures Procedures (including critical care time)  Medications Ordered in UC Medications - No data to display  Initial Impression / Assessment and Plan / UC Course  I have reviewed the triage vital signs and the nursing notes.  Pertinent labs & imaging results that were available during  my care of the patient were reviewed by me and considered in my medical decision making (see chart for details).      Final Clinical Impressions(s) / UC Diagnoses   Final diagnoses:  Lower urinary tract infectious disease  Acute nasopharyngitis    ED Prescriptions    Medication Sig Dispense Auth. Provider   cephALEXin (KEFLEX) 500 MG capsule Take 1 capsule (500 mg total) by mouth 2 (two) times daily. 14 capsule Iyauna Sing, Pamala Hurry, MD   fluconazole (DIFLUCAN) 150 MG tablet Take 1 tablet (150 mg total) by mouth daily. 1 tablet Payton Mccallum, MD     1. Lab results and diagnosis reviewed with patient 2. rx as per orders above; reviewed possible side effects, interactions, risks and benefits  3. Recommend supportive treatment with increase water intake, otc meds prn 4. Follow-up prn if symptoms worsen or don't improve   Controlled Substance Prescriptions [place] Controlled Substance Registry consulted? Not Applicable   Payton Mccallum, MD 06/24/18 1048

## 2018-06-27 ENCOUNTER — Telehealth (HOSPITAL_COMMUNITY): Payer: Self-pay | Admitting: Emergency Medicine

## 2018-06-27 LAB — URINE CULTURE
Culture: >=100000 — AB
SPECIAL REQUESTS: NORMAL

## 2018-06-27 NOTE — Telephone Encounter (Signed)
Urine culture was positive for Escherichia coli and was given keflex  at urgent care visit. Pt contacted and made aware, educated on completing antibiotic and to follow up if symptoms are persistent. Verbalized understanding. LVMM

## 2018-11-27 IMAGING — CR DG ANKLE COMPLETE 3+V*L*
3 series · 3 of 3 positions shown · non-contrast
Comparison: None.

CLINICAL DATA: Left lateral ankle pain and left foot pain 2 days
after a fall.

EXAM:
LEFT ANKLE COMPLETE - 3+ VIEW

[ankle ap]
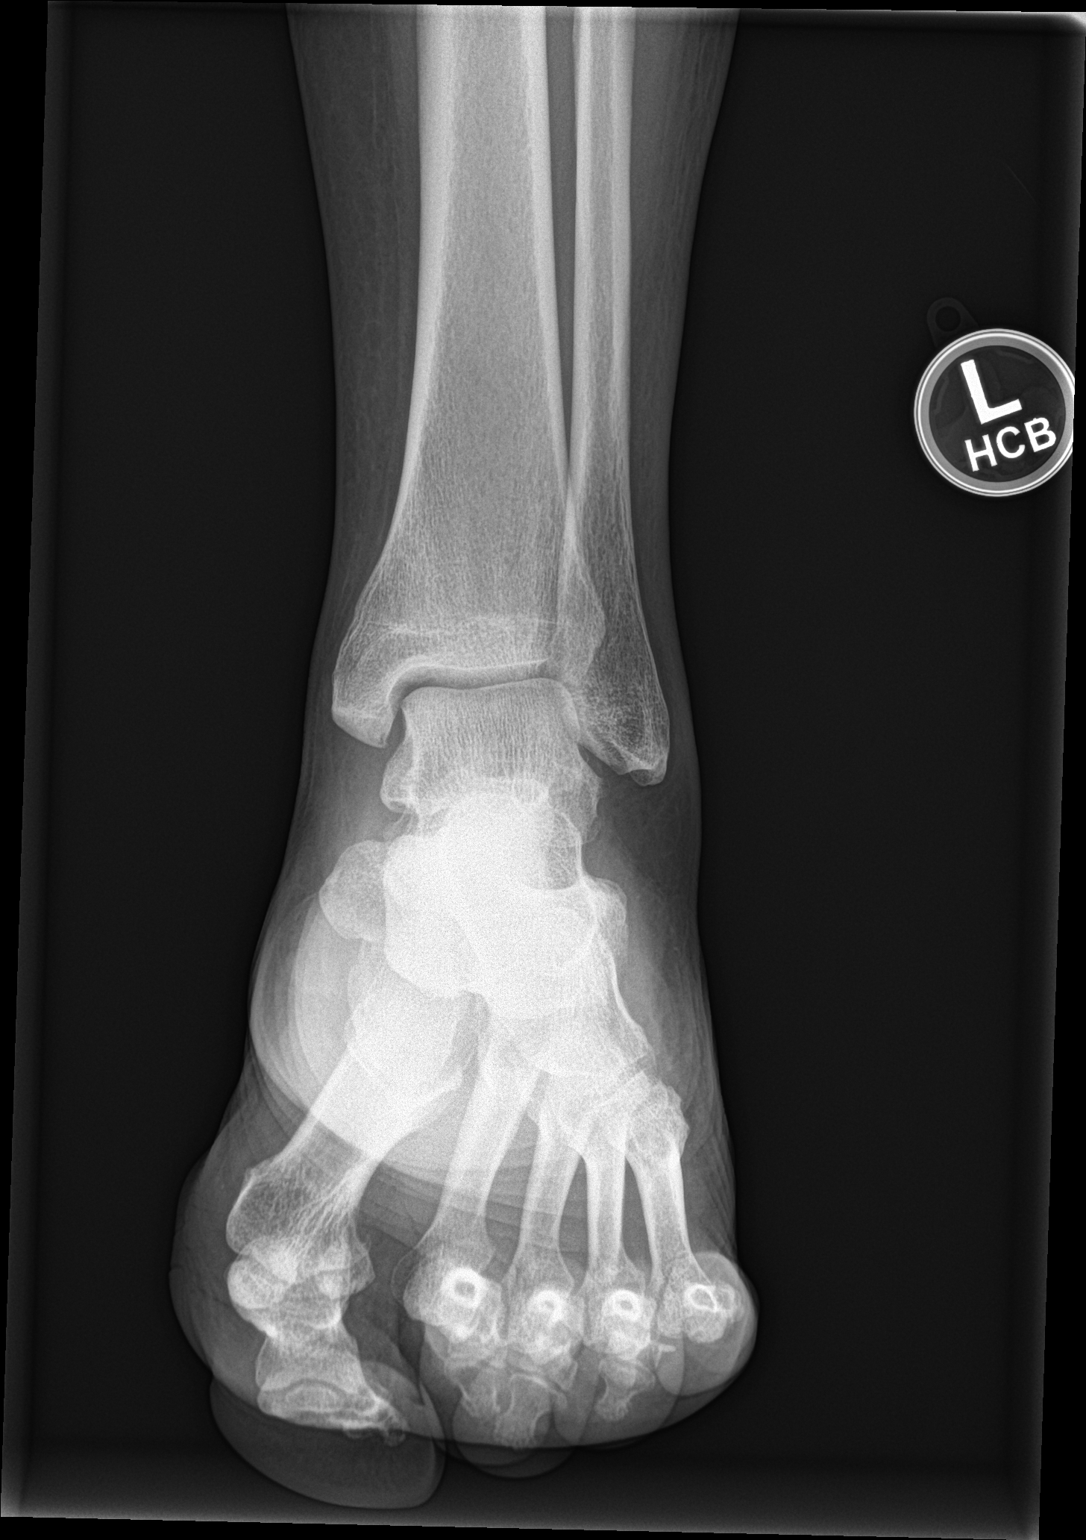

[ankle obl]
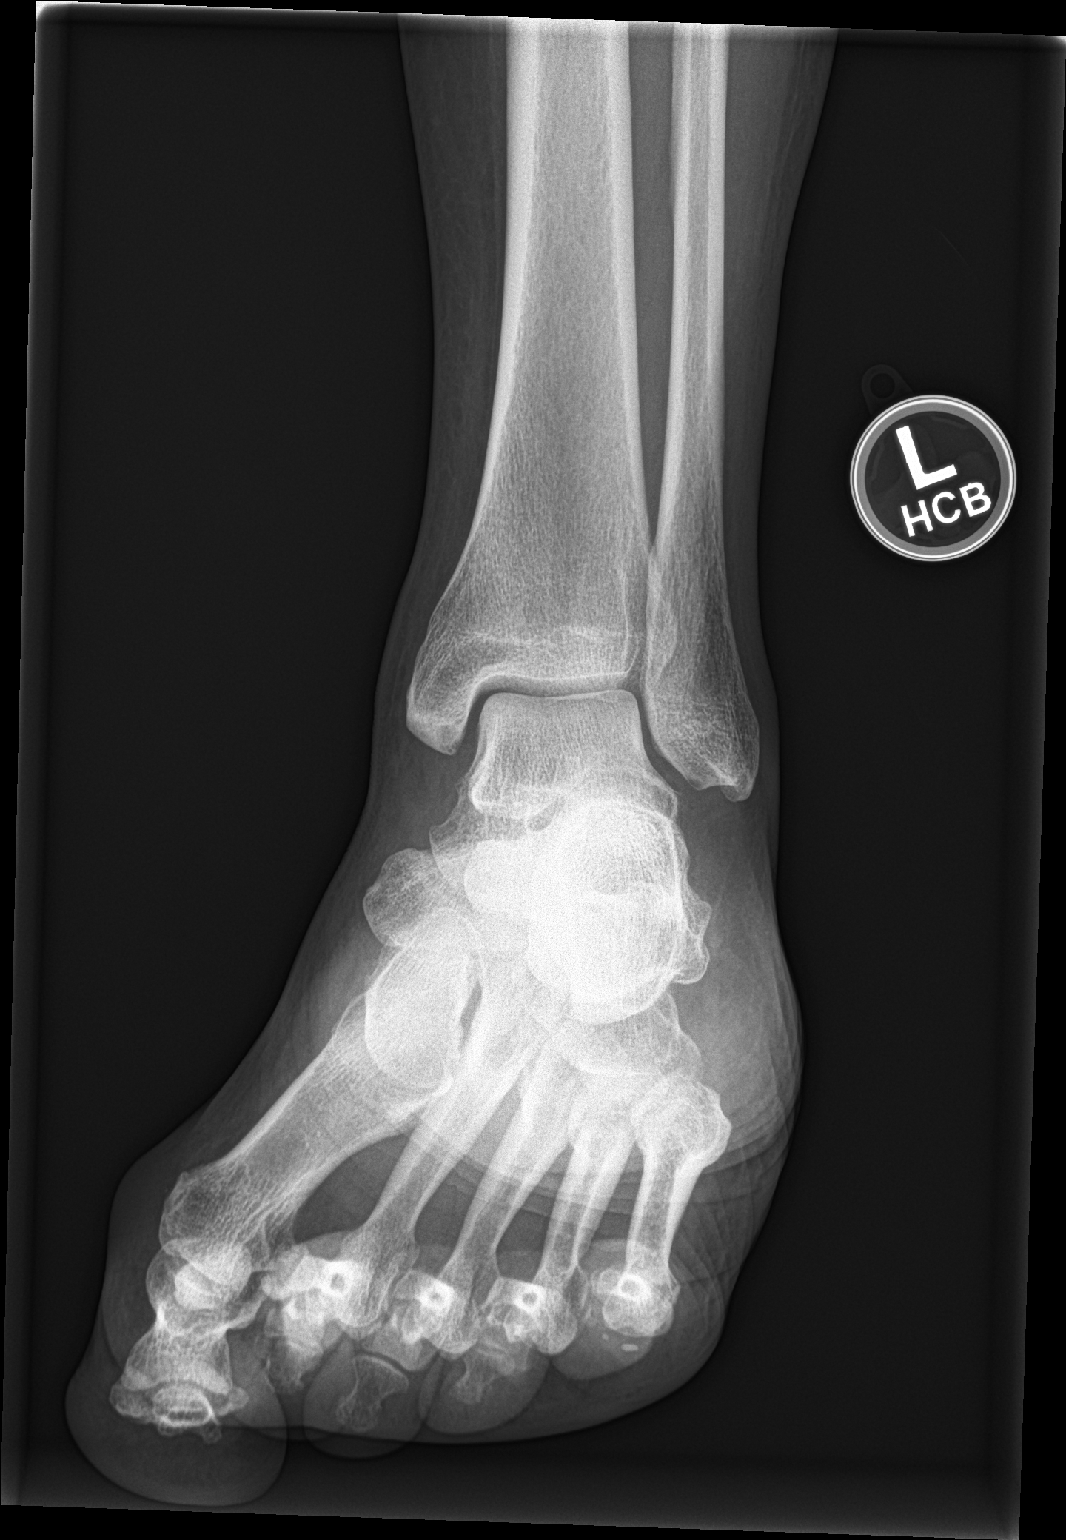

[ankle lat]
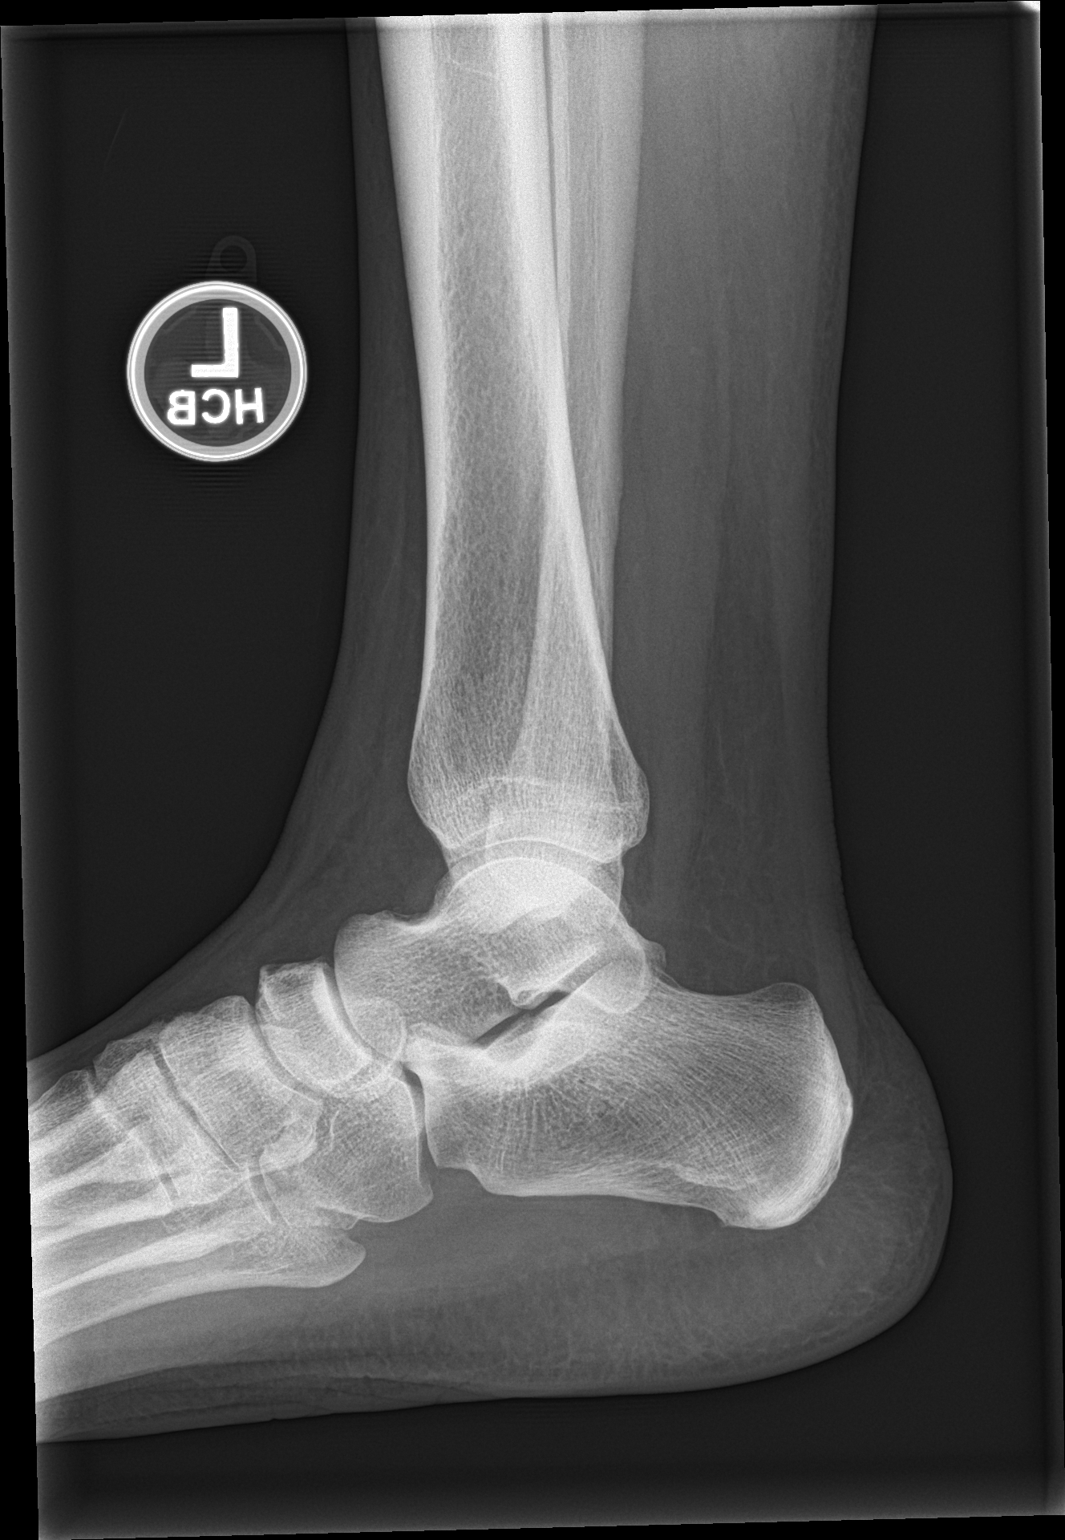

[3 of 3 positions shown; findings below may reference images not displayed]

FINDINGS: There is no evidence of fracture, dislocation, or joint effusion.
There is no evidence of arthropathy or other focal bone abnormality.
Soft tissues are unremarkable.
IMPRESSION: Negative.

## 2019-07-26 ENCOUNTER — Encounter: Payer: Self-pay | Admitting: Emergency Medicine

## 2019-07-26 ENCOUNTER — Ambulatory Visit (INDEPENDENT_AMBULATORY_CARE_PROVIDER_SITE_OTHER): Payer: Medicare PPO

## 2019-07-26 ENCOUNTER — Ambulatory Visit
Admission: EM | Admit: 2019-07-26 | Discharge: 2019-07-26 | Disposition: A | Discharge status: Home / Self Care | Payer: Medicare PPO | Attending: Emergency Medicine | Admitting: Emergency Medicine

## 2019-07-26 ENCOUNTER — Other Ambulatory Visit: Payer: Self-pay

## 2019-07-26 DIAGNOSIS — G8929 Other chronic pain: Secondary | ICD-10-CM | POA: Diagnosis not present

## 2019-07-26 DIAGNOSIS — R918 Other nonspecific abnormal finding of lung field: Secondary | ICD-10-CM | POA: Diagnosis not present

## 2019-07-26 DIAGNOSIS — M5442 Lumbago with sciatica, left side: Secondary | ICD-10-CM | POA: Diagnosis not present

## 2019-07-26 DIAGNOSIS — F17210 Nicotine dependence, cigarettes, uncomplicated: Secondary | ICD-10-CM

## 2019-07-26 MED ORDER — ACETAMINOPHEN 500 MG PO TABS
1000.0000 mg | ORAL_TABLET | Freq: Once | ORAL | Status: AC
  Administered 2019-07-26: 15:00:00 1000 mg via ORAL

## 2019-07-26 MED ORDER — KETOROLAC TROMETHAMINE 60 MG/2ML IM SOLN
30.0000 mg | Freq: Once | INTRAMUSCULAR | Status: AC
  Administered 2019-07-26: 15:00:00 30 mg via INTRAMUSCULAR

## 2019-07-26 NOTE — Discharge Instructions (Addendum)
Your x-ray showed that you have an ill-defined opacity in your left upper lobe towards the apex.  It measures 4.0 x 2.3 cm.  Radiology has recommended that you get an immediate chest CT with IV contrast to further evaluate this.  If you cannot get it done through your doctor within the next few days, you may return here after making sure that we have CT capacity that day and we can do it.  We will also need to obtain labs to make sure that your kidneys are functioning properly.  If you have any concerns chest pain shortness of breath fevers above 100.4, it is okay to go to the ER tonight to get it done.  Increase your Tylenol to 1000 mg 3 or 4 times a day in addition to taking your other regular medications.  Not exceed 4 g of Tylenol from all sources in 1 day

## 2019-07-26 NOTE — ED Triage Notes (Signed)
Patient c/o left sided lower back pain that goes down her left leg for couple of weeks.  Patient was seen at [facility] last week.  Patient states that she has an MRI scheduled on Tuesday.

## 2019-07-26 NOTE — ED Provider Notes (Signed)
HPI  SUBJECTIVE:  Sally Barnes is a 49 y.o. female who presents with 3 weeks of constant daily sharp throbbing bilateral low back pain with pins-and-needles radiating down her left leg to her foot.  She reports night sweats and a 9 pound unintentional weight loss recently.  She denies heavy lifting, trauma, change in physical activity.  No saddle anesthesia, bilateral radicular pain, leg weakness, urinary complaints, urinary fecal incontinence, urinary retention.  No coughing wheezing shortness of breath hemoptysis.  She tried increasing her baclofen, Neurontin, is taking 500 mg of Tylenol twice a day.  She has also tried pushing fluids, heat.  She has been treated over the past 3 weeks with a Medrol Dosepak which she states did not work, 2 doses of Toradol with  relief.  No aggravating factors.  Symptoms not associated with bending forward torso rotation sitting standing.  No antipyretic in the past 4 to 6 hours.  Seen at [facility] urgent care on 07/09/2018 for bilateral low back pain.  She was thought to have acute bilateral low back pain with left-sided sciatica and chronic pain syndrome.  She was given Toradol, advised heat.  She was noted to be already taking Tylenol amitriptyline baclofen Cymbalta gabapentin lidocaine ointment and Zanaflex.  Returned to the urgent care 3/25 after finishing Medrol Dosepak and was also reporting night sweats.  Had a lumbar x-ray which showed multilevel degenerative disc disease with prominent intervertebral narrowing most marked at L5-S1, multilevel facet joint osteoarthritis.  No acute fractures.  She also had an x-ray of the left hip done which showed a lucency suggesting an intramedullary left proximal femoral lesion concerning for myelomatous deposit or metastatic disease.  Blood work for thyroid disease and menopause was sent.  TSH slightly below normal, LH FSH normal.  Patient states and record confirms that she  has an appointment with her PMD tomorrow, patient  states it is for an injection of Toradol only, states that she is not going to be able to make it due to transportation issues.  She has an appointment with radiology for the MRI on 4/6 states that she will be able to make this appointment.  History of small bowel obstruction from opiates.  Was told not to take NSAIDs orally due to gastric bypass.  Has a history of chronic pain, diabetes, fibromyalgia, hypertension, L sciatica, osteoarthritis.  She is a smoker.  No history of cancer, multiple myeloma, prolonged steroid use, osteoporosis.  Family history significant for mother, multiple sisters with breast cancer  PMD: Dr. Delena Serve at [facility], pain management: Dr. Leone Payor  Past Medical History:  Diagnosis Date  . Anxiety   . Asthma   . Chronic pain   . Depression   . Diabetes ([place])   . Fibromyalgia   . HTN (hypertension)   . Migraines   . Neuropathy   . Osteoarthritis   . Sciatica     Past Surgical History:  Procedure Laterality Date  . APPENDECTOMY    . GASTRIC BYPASS    . OSTEOTOMY      Family History  Problem Relation Age of Onset  . Cancer Mother        breast  . Diabetes Mother   . Hyperlipidemia Mother   . Kidney failure Mother   . Cancer Father        throat  . Heart failure Father     Social History   Tobacco Use  . Smoking status: Current Every Day Smoker    Packs/day:  1.00    Types: Cigarettes  . Smokeless tobacco: Never Used  Substance Use Topics  . Alcohol use: Yes    Comment: social  . Drug use: No    No current facility-administered medications for this encounter.  Current Outpatient Medications:  .  amitriptyline (ELAVIL) 10 MG tablet, Take 10 mg by mouth at bedtime., Disp: , Rfl:  .  baclofen (LIORESAL) 10 MG tablet, Take 10 mg by mouth 3 (three) times daily., Disp: , Rfl:  .  [facility] MELATONIN 3 MG TABS, Take 1 tablet by mouth at bedtime., Disp: , Rfl:  .  [facility] SENNA 8.6 MG tablet, TAKE 2 TABLETS BY MOUTH EVERY DAY AT NIGHT, Disp: , Rfl:  .   DULoxetine (CYMBALTA) 30 MG capsule, Take 30 mg by mouth daily., Disp: , Rfl:  .  fluticasone (FLONASE) 50 MCG/ACT nasal spray, Place 2 sprays into both nostrils daily., Disp: 16 g, Rfl: 0 .  levonorgestrel (MIRENA) 20 MCG/24HR IUD, 1 each by Intrauterine route once., Disp: , Rfl:  .  lisinopril (PRINIVIL,ZESTRIL) 2.5 MG tablet, Take 5 mg by mouth daily. , Disp: , Rfl:  .  LYRICA 50 MG capsule, TAKE 1 CAPSULE 3 TIMES A DAY X 7 DAYS, THEN 1 CAP 2X DAILY X 7 DAYS, THEN 1 CAP DAILY X 7 DAYS, Disp: , Rfl: 0 .  magnesium oxide (MAG-OX) 400 MG tablet, Take 1 tablet by mouth daily., Disp: , Rfl:  .  metFORMIN (GLUCOPHAGE) 500 MG tablet, Take by mouth 2 (two) times daily with a meal., Disp: , Rfl:  .  montelukast (SINGULAIR) 10 MG tablet, TAKE 1 TABLET BY MOUTH EVERY DAY AT NIGHT, Disp: , Rfl: 3 .  potassium chloride (K-DUR,KLOR-CON) 10 MEQ tablet, Take by mouth., Disp: , Rfl:  .  pregabalin (LYRICA) 200 MG capsule, Take 200 mg by mouth 3 (three) times daily., Disp: , Rfl:  .  tiZANidine (ZANAFLEX) 4 MG capsule, Take 4 mg by mouth 3 (three) times daily., Disp: , Rfl:  .  traZODone (DESYREL) 100 MG tablet, Take by mouth., Disp: , Rfl:  .  albuterol (PROVENTIL HFA;VENTOLIN HFA) 108 (90 Base) MCG/ACT inhaler, Inhale into the lungs every 6 (six) hours as needed for wheezing or shortness of breath., Disp: , Rfl:  .  DULoxetine (CYMBALTA) 60 MG capsule, Take 60 mg by mouth daily., Disp: , Rfl:  .  fluconazole (DIFLUCAN) 150 MG tablet, Take 1 tablet (150 mg total) by mouth daily., Disp: 1 tablet, Rfl: 0 .  mometasone (NASONEX) 50 MCG/ACT nasal spray, Place 2 sprays into the nose daily., Disp: , Rfl:   Allergies  Allergen Reactions  . Ondansetron Hcl   . Phenobarbital Other (See Comments)    hallucinations  . Sulfa Antibiotics     palpitations  . Tylenol With Codeine #3 [Acetaminophen-Codeine] Other (See Comments)    shakes  . Naprosyn [Naproxen] Palpitations  . Percocet [Oxycodone-Acetaminophen]  Palpitations     ROS  As noted in HPI.   Physical Exam  BP 132/78 (BP Location: Left Arm)   Pulse 96   Temp 98.7 F (37.1 C) (Oral)   Resp 16   Ht '5\' 7"'$  (1.702 m)   Wt 96.6 kg   SpO2 100%   BMI 33.36 kg/m   Constitutional: Well developed, well nourished, no acute distress Eyes:  EOMI, conjunctiva normal bilaterally HENT: Normocephalic, atraumatic,mucus membranes moist Respiratory: Normal inspiratory effort, lungs clear bilaterally.  No T-spine tenderness. Cardiovascular: Normal rate GI: nondistended. No suprapubic tenderness skin: No  rash, skin intact Musculoskeletal: no CVAT. + Bilateral paralumbar tenderness,  - muscle spasm.  No T-spine tenderness.  Positive diffuse L-spine tenderness. Bilateral lower extremities nontender, baseline ROM with intact DP pulses, pain with internal and external rotation of the left hip.  Back pain aggravated with flexion of the hips bilaterally.  SLR positive left side.  sensation baseline light touch bilaterally for Pt, patellar reflexes diminished bilaterally.  Patient states this is not new., Motor symmetric bilateral 5/5 hip flexion, quadriceps, hamstrings, EHL, foot dorsiflexion, foot plantarflexion, gait normal Neurologic: Alert & oriented x 3, no focal neuro deficits Psychiatric: Speech and behavior appropriate   ED Course   Medications  ketorolac (TORADOL) injection 30 mg (30 mg Intramuscular Given 07/26/19 1515)  acetaminophen (TYLENOL) tablet 1,000 mg (1,000 mg Oral Given 07/26/19 1516)    Orders Placed This Encounter  Procedures  . DG Chest 2 View    Standing Status:   Standing    Number of Occurrences:   1    Order Specific Question:   Reason for Exam (SYMPTOM  OR DIAGNOSIS REQUIRED)    Answer:   r/o CA. Smoker.    No results found for this or any previous visit (from the past 24 hour(s)). DG Chest 2 View  Result Date: 07/26/2019 CLINICAL DATA:  Tobacco use. EXAM: CHEST - 2 VIEW COMPARISON:  October 27, 2017 FINDINGS: There is  ill-defined opacity in the left upper lobe toward the apex. Lungs otherwise are clear. There is stable sclerosis in the region of the anterior right first rib. Other bony structures appear unremarkable. Heart size and pulmonary vascularity are normal. No adenopathy. IMPRESSION: Ill-defined opacity left upper lobe toward the apex. This ill-defined opacity measures 4.0 x 2.3 cm. While this area has an appearance suggestive of pneumonia, an underlying neoplasm in this area could present in this manner. Given the clinical history and lack of clinical symptoms suggesting pneumonia, it may be prudent to correlate with chest CT, ideally with intravenous contrast, at this time to further evaluate. Lungs elsewhere clear.  No adenopathy appreciable by radiography. These results will be called to the ordering clinician or representative by the Radiologist Assistant, and communication documented in the PACS or [employer]. Electronically Signed   By: Lowella Grip III M.D.   On: 07/26/2019 15:23    ED Clinical Impression  1. Chronic midline low back pain with left-sided sciatica   2. Opacity of lung on imaging study     ED Assessment/Plan    Care everywhere records, labs, imaging reports extensively reviewed.  Will not repeat imaging of the back or hip as they were done about a week ago.  However will get chest x-ray because she is a smoker and there was concern for metastatic disease on her hip x-ray.  She denies fevers body aches cough hemoptysis chest pain shortness of breath.  Reviewed imaging independently.  Ill-defined opacity left upper lobe measuring 4 x 2.3 cm.  Concerning for pneumonia versus underlying neoplasm.  Chest CT with IV contrast recommended.  See radiology report for details.  will give Toradol 30 mg and 1000 mg of Tylenol together here.  Patient will continue her current pain medication regimen however will increase her Tylenol from 500 mg twice a day to 1000 mg 3 or 4 times a  day.  Max dose of 4 g of Tylenol in 24 hours.  She is to call her PMD tomorrow notifying them of today's visit and to schedule an outpatient CT with IV  contrast ASAP.  She states that she feels confident that she can get a chest CT within the next few days through her PCP.  Discussed with her that we can do it here, but both of [place] have agreed that doing it through her PMD who can coordinate care is a better choice.  Advised her that she can return here to get it done if she cannot get a chest CT through her primary care physician within several days.  On reevaluation, patient states that she feels better.    We will not treat her for pneumonia today as I do not think the opacity is pneumonia in the absence of any other symptoms.  Given that she will be able to get a CT within the next few days, feel that it is reasonable to wait to further characterize this mass prior to initiating antibiotic treatment.  She agrees with this.  Gave her ER return precautions.  Discussed imaging, MDM, treatment plan, and plan for follow-up with patient. Discussed sn/sx that should prompt return to the ED. patient agrees with plan.   Meds ordered this encounter  Medications  . ketorolac (TORADOL) injection 30 mg  . acetaminophen (TYLENOL) tablet 1,000 mg    *This clinic note was created using [employer]. Therefore, there may be occasional mistakes despite careful proofreading.  ?     Melynda Ripple, MD 07/26/19 1600
